# Patient Record
Sex: Female | Born: 1980 | Hispanic: No | Marital: Married | Smoking: Never smoker
Health system: Southern US, Community
[De-identification: ages and names within clinical notes are randomized; demographics above are authoritative.]

## PROBLEM LIST (undated history)

## (undated) DIAGNOSIS — Z789 Other specified health status: Secondary | ICD-10-CM

## (undated) DIAGNOSIS — O139 Gestational [pregnancy-induced] hypertension without significant proteinuria, unspecified trimester: Secondary | ICD-10-CM

## (undated) DIAGNOSIS — A159 Respiratory tuberculosis unspecified: Secondary | ICD-10-CM

## (undated) HISTORY — DX: Respiratory tuberculosis unspecified: A15.9

## (undated) HISTORY — DX: Gestational (pregnancy-induced) hypertension without significant proteinuria, unspecified trimester: O13.9

## (undated) HISTORY — DX: Other specified health status: Z78.9

---

## 2005-02-06 DIAGNOSIS — O139 Gestational [pregnancy-induced] hypertension without significant proteinuria, unspecified trimester: Secondary | ICD-10-CM

## 2010-11-08 ENCOUNTER — Other Ambulatory Visit: Payer: Self-pay | Admitting: Infectious Diseases

## 2010-11-08 ENCOUNTER — Ambulatory Visit
Admission: RE | Admit: 2010-11-08 | Discharge: 2010-11-08 | Disposition: A | Discharge status: Home / Self Care | Payer: No Typology Code available for payment source | Source: Ambulatory Visit | Attending: Infectious Diseases | Admitting: Infectious Diseases

## 2010-11-08 DIAGNOSIS — R7611 Nonspecific reaction to tuberculin skin test without active tuberculosis: Secondary | ICD-10-CM

## 2018-03-22 ENCOUNTER — Other Ambulatory Visit: Payer: Self-pay

## 2018-03-22 ENCOUNTER — Encounter (HOSPITAL_COMMUNITY): Payer: Self-pay | Admitting: Emergency Medicine

## 2018-03-22 ENCOUNTER — Emergency Department (HOSPITAL_COMMUNITY): Payer: Medicaid Other

## 2018-03-22 ENCOUNTER — Emergency Department (HOSPITAL_COMMUNITY)
Admission: EM | Admit: 2018-03-22 | Discharge: 2018-03-22 | Disposition: A | Discharge status: Home / Self Care | Payer: Medicaid Other | Attending: Emergency Medicine | Admitting: Emergency Medicine

## 2018-03-22 DIAGNOSIS — Z3A11 11 weeks gestation of pregnancy: Secondary | ICD-10-CM

## 2018-03-22 DIAGNOSIS — O4690 Antepartum hemorrhage, unspecified, unspecified trimester: Secondary | ICD-10-CM

## 2018-03-22 DIAGNOSIS — O209 Hemorrhage in early pregnancy, unspecified: Secondary | ICD-10-CM | POA: Diagnosis not present

## 2018-03-22 LAB — I-STAT BETA HCG BLOOD, ED (MC, WL, AP ONLY): I-stat hCG, quantitative: >2000 m[IU]/mL — ABNORMAL HIGH (ref <5)

## 2018-03-22 LAB — WET PREP, GENITAL
Clue Cells Wet Prep HPF POC: NONE SEEN
Sperm: NONE SEEN
Trich, Wet Prep: NONE SEEN
YEAST WET PREP: NONE SEEN

## 2018-03-22 LAB — URINALYSIS, ROUTINE W REFLEX MICROSCOPIC
Bilirubin Urine: NEGATIVE
Glucose, UA: NEGATIVE mg/dL
Ketones, ur: NEGATIVE mg/dL
Leukocytes, UA: NEGATIVE
Nitrite: NEGATIVE
Protein, ur: NEGATIVE mg/dL
SPECIFIC GRAVITY, URINE: 1.002 — AB (ref 1.005–1.030)
pH: 8 (ref 5.0–8.0)

## 2018-03-22 LAB — CBC WITH DIFFERENTIAL/PLATELET
Abs Immature Granulocytes: 0.02 10*3/uL (ref 0.00–0.07)
Basophils Absolute: 0 10*3/uL (ref 0.0–0.1)
Basophils Relative: 0 %
Eosinophils Absolute: 0.1 10*3/uL (ref 0.0–0.5)
Eosinophils Relative: 1 %
HCT: 35.1 % — ABNORMAL LOW (ref 36.0–46.0)
Hemoglobin: 11.1 g/dL — ABNORMAL LOW (ref 12.0–15.0)
IMMATURE GRANULOCYTES: 0 %
Lymphocytes Relative: 20 %
Lymphs Abs: 2.1 10*3/uL (ref 0.7–4.0)
MCH: 25.7 pg — ABNORMAL LOW (ref 26.0–34.0)
MCHC: 31.6 g/dL (ref 30.0–36.0)
MCV: 81.3 fL (ref 80.0–100.0)
MONOS PCT: 5 %
Monocytes Absolute: 0.5 10*3/uL (ref 0.1–1.0)
Neutro Abs: 7.6 10*3/uL (ref 1.7–7.7)
Neutrophils Relative %: 74 %
PLATELETS: 169 10*3/uL (ref 150–400)
RBC: 4.32 MIL/uL (ref 3.87–5.11)
RDW: 13.4 % (ref 11.5–15.5)
WBC: 10.4 10*3/uL (ref 4.0–10.5)
nRBC: 0 % (ref 0.0–0.2)

## 2018-03-22 LAB — ABO/RH: ABO/RH(D): O POS

## 2018-03-22 LAB — BASIC METABOLIC PANEL
Anion gap: 9 (ref 5–15)
BUN: <5 mg/dL — ABNORMAL LOW (ref 6–20)
CO2: 21 mmol/L — ABNORMAL LOW (ref 22–32)
Calcium: 9.6 mg/dL (ref 8.9–10.3)
Chloride: 107 mmol/L (ref 98–111)
Creatinine, Ser: 0.48 mg/dL (ref 0.44–1.00)
GFR calc Af Amer: >60 mL/min (ref >60)
GFR calc non Af Amer: >60 mL/min (ref >60)
GLUCOSE: 111 mg/dL — AB (ref 70–99)
Potassium: 3.5 mmol/L (ref 3.5–5.1)
Sodium: 137 mmol/L (ref 135–145)

## 2018-03-22 NOTE — Discharge Instructions (Signed)
Return to ED for worsening symptoms, increased bleeding, lightheadedness, severe abdominal pain, chest pain or leg swelling.

## 2018-03-22 NOTE — ED Notes (Signed)
Patient verbalizes understanding of discharge instructions. Opportunity for questioning and answers were provided. Armband removed by staff, pt discharged from ED ambulatory to home.  

## 2018-03-22 NOTE — ED Notes (Signed)
Patient transported to Ultrasound 

## 2018-03-22 NOTE — ED Triage Notes (Signed)
N[place]ali Interpreter on ipad used during triage. Pt states she is 3 months pregnant and had vaginal bleeding around 11am today. Stopped for a little bit and is bleeding again now. When asked about how much bleeding, Pt states bleeding is like "period bleeding." Pt states she originally had some abdominal pain but currently only has back pain. Pt states she went to a hospital for an appointment yesterday and she took a vitamin and now has this new problem.

## 2018-03-22 NOTE — ED Provider Notes (Signed)
[facility] EMERGENCY DEPARTMENT Provider Note   CSN: [id number] Arrival date & time: 03/22/18  1526     History   Chief Complaint Chief Complaint  Patient presents with  . Vaginal Bleeding    HPI Kristine Silva is a 38 y.o. female who is currently 3 months pregnant presents to ED for vaginal bleeding that began 5 hours prior to arrival.  Patient states that she receives her prenatal care at the public health department.  She was given a prenatal vitamin yesterday and she believes this caused her bleeding.  She has gone through 1 pad since the bleeding started.  She complains of back pain but denies any abdominal or pelvic pain.  She has not yet received an ultrasound for this pregnancy.  She did not have bleeding with her prior to successful pregnancies.  She denies any anticoagulant use, changes to bowel movements, vomiting, fever, urinary symptoms.  The history is limited by a language barrier. A language interpreter was used.    History reviewed. No pertinent past medical history.  There are no active problems to display for this patient.   History reviewed. No pertinent surgical history.   OB History    Gravida  1   Para      Term      Preterm      AB      Living        SAB      TAB      Ectopic      Multiple      Live Births               Home Medications    Prior to Admission medications   Not on File    Family History No family history on file.  Social History Social History   Tobacco Use  . Smoking status: Not on file  Substance Use Topics  . Alcohol use: Not on file  . Drug use: Not on file     Allergies   Patient has no known allergies.   Review of Systems Review of Systems  Constitutional: Negative for appetite change and chills.  HENT: Negative for ear pain, rhinorrhea, sneezing and sore throat.   Eyes: Negative for photophobia and visual disturbance.  Respiratory: Negative for cough, chest  tightness, shortness of breath and wheezing.   Cardiovascular: Negative for chest pain and palpitations.  Gastrointestinal: Negative for blood in stool, constipation, diarrhea and vomiting.  Genitourinary: Positive for vaginal bleeding. Negative for hematuria and urgency.  Musculoskeletal: Positive for back pain. Negative for myalgias.  Skin: Negative for rash.  Neurological: Negative for weakness and light-headedness.     Physical Exam Updated Vital Signs BP 118/81 (BP Location: Right Arm)   Pulse 83   Temp 98.9 F (37.2 C) (Oral)   Resp 18   SpO2 100%   Physical Exam Vitals signs and nursing note reviewed. Exam conducted with a chaperone present.  Constitutional:      General: She is not in acute distress.    Appearance: She is well-developed.  HENT:     Head: Normocephalic and atraumatic.     Nose: Nose normal.  Eyes:     General: No scleral icterus.       Right eye: No discharge.        Left eye: No discharge.     Conjunctiva/sclera: Conjunctivae normal.  Neck:     Musculoskeletal: Normal range of motion and neck supple.  Cardiovascular:     Rate and Rhythm: Normal rate and regular rhythm.     Heart sounds: Normal heart sounds. No murmur. No friction rub. No gallop.   Pulmonary:     Effort: Pulmonary effort is normal. No respiratory distress.     Breath sounds: Normal breath sounds.  Abdominal:     General: Bowel sounds are normal. There is no distension.     Palpations: Abdomen is soft.     Tenderness: There is no abdominal tenderness. There is no guarding.  Genitourinary:    Vagina: Bleeding present.     Cervix: No cervical bleeding.     Uterus: Not tender.      Adnexa:        Right: No tenderness.         Left: No tenderness.       Comments: Pelvic exam: normal external genitalia without evidence of trauma. VULVA: normal appearing vulva with no masses, tenderness or lesion. VAGINA: normal appearing vagina with normal color and brown discharge noted, scant  vaginal bleeding.  No lesions. CERVIX: normal appearing cervix without lesions, cervical motion tenderness absent, cervical os closed with out purulent discharge; No vaginal discharge. Wet prep and DNA probe for chlamydia and GC obtained.   ADNEXA: normal adnexa in size, nontender and no masses UTERUS: uterus is normal size, shape, consistency and nontender.   Musculoskeletal: Normal range of motion.  Skin:    General: Skin is warm and dry.     Findings: No rash.  Neurological:     Mental Status: She is alert.     Motor: No abnormal muscle tone.     Coordination: Coordination normal.      ED Treatments / Results  Labs (all labs ordered are listed, but only abnormal results are displayed) Labs Reviewed  WET PREP, GENITAL - Abnormal; Notable for the following components:      Result Value   WBC, Wet Prep HPF POC RARE (*)    All other components within normal limits  BASIC METABOLIC PANEL - Abnormal; Notable for the following components:   CO2 21 (*)    Glucose, Bld 111 (*)    BUN <5 (*)    All other components within normal limits  CBC WITH DIFFERENTIAL/PLATELET - Abnormal; Notable for the following components:   Hemoglobin 11.1 (*)    HCT 35.1 (*)    MCH 25.7 (*)    All other components within normal limits  URINALYSIS, ROUTINE W REFLEX MICROSCOPIC - Abnormal; Notable for the following components:   Color, Urine STRAW (*)    Specific Gravity, Urine 1.002 (*)    Hgb urine dipstick LARGE (*)    Bacteria, UA RARE (*)    All other components within normal limits  I-STAT BETA HCG BLOOD, ED ([facility], [facility], [facility] ONLY) - Abnormal; Notable for the following components:   I-stat hCG, quantitative >2,000.0 (*)    All other components within normal limits  URINE CULTURE  ABO/RH  GC/CHLAMYDIA PROBE AMP ([place]) NOT AT [facility]    EKG None  Radiology [place] Ob Comp < 14 Wks  Result Date: 03/22/2018 CLINICAL DATA:  Pregnant patient with bleeding. EXAM: OBSTETRIC <14 WK US AND TRANSVAGINAL  OB US TECHNIQUE: Both transabdominal and transvaginal ultrasound examinations were performed for complete evaluation of the gestation as well as the maternal uterus, adnexal regions, and pelvic cul-de-sac. Transvaginal technique was performed to assess early pregnancy. COMPARISON:  None. FINDINGS: Intrauterine gestational sac: Single Yolk sac:  Not Visualized.  Embryo:  Visualized. Cardiac Activity: Visualized. Heart Rate: 185 bpm MSD:   mm    w     d CRL:  44.8 mm   11 w   2 d                  [place] EDC: October 09, 2018 Subchorionic hemorrhage:  None visualized. Maternal uterus/adnexae: Ovaries are normal in appearance. There is a small amount of fluid in the cervical canal. No funneling of the cervix identified. IMPRESSION: 1. Single live IUP. 2. There is a small amount of fluid in the cervical canal which is nonspecific. No cervical funneling identified. Electronically Signed   By: Gerome Sam III M.D   On: 03/22/2018 18:55   US Ob Transvaginal  Result Date: 03/22/2018 CLINICAL DATA:  Pregnant patient with bleeding. EXAM: OBSTETRIC <14 WK [place] AND TRANSVAGINAL OB US TECHNIQUE: Both transabdominal and transvaginal ultrasound examinations were performed for complete evaluation of the gestation as well as the maternal uterus, adnexal regions, and pelvic cul-de-sac. Transvaginal technique was performed to assess early pregnancy. COMPARISON:  None. FINDINGS: Intrauterine gestational sac: Single Yolk sac:  Not Visualized. Embryo:  Visualized. Cardiac Activity: Visualized. Heart Rate: 185 bpm MSD:   mm    w     d CRL:  44.8 mm   11 w   2 d                  [place] EDC: October 09, 2018 Subchorionic hemorrhage:  None visualized. Maternal uterus/adnexae: Ovaries are normal in appearance. There is a small amount of fluid in the cervical canal. No funneling of the cervix identified. IMPRESSION: 1. Single live IUP. 2. There is a small amount of fluid in the cervical canal which is nonspecific. No cervical funneling identified.  Electronically Signed   By: Gerome Sam III M.D   On: 03/22/2018 18:55    Procedures Procedures (including critical care time)  Medications Ordered in ED Medications - No data to display   Initial Impression / Assessment and Plan / ED Course  I have reviewed the triage vital signs and the nursing notes.  Pertinent labs & imaging results that were available during my care of the patient were reviewed by me and considered in my medical decision making (see chart for details).     38 year old female who is currently 3 months pregnant presents to ED for vaginal bleeding.  The bleeding began this morning.  She was seen and evaluated at the health department yesterday and was given prenatal vitamins.  She took 1 dose of this and believes that this is causing the bleeding.  She has only used 1 pad since bleeding started.  She has not yet undergone an ultrasound for this pregnancy.  Her prior 2 pregnancies were successful.  On my exam there is no abdominal tenderness palpation.  She is overall well-appearing.  Vital signs are within normal limits.  hCG is greater than 2000.  Lab work significant for hemoglobin of 11, urinalysis with rare bacteria and sent for culture.  BMP, wet prep is unremarkable.  Ultrasound shows single live IUP.  Pelvic exam revealed only scant amount of bleeding with a closed cervical os.  Will advise patient to continue prenatal vitamins, follow-up with PCP and to return to ED or [facility] for any worsening symptoms.  Patient is hemodynamically stable, in NAD, and able to ambulate in the ED. Evaluation does not show pathology that would require ongoing emergent intervention or inpatient treatment.  I explained the diagnosis to the patient. Pain has been managed and has no complaints prior to discharge. Patient is comfortable with above plan and is stable for discharge at this time. All questions were answered prior to disposition. Strict return precautions for returning  to the ED were discussed. Encouraged follow up with PCP.    Portions of this note were generated with Scientist, clinical (histocompatibility and immunogenetics)Dragon dictation software. Dictation errors may occur despite best attempts at proofreading.  Final Clinical Impressions(s) / ED Diagnoses   Final diagnoses:  [redacted] weeks gestation of pregnancy  Vaginal bleeding in pregnancy    ED Discharge Orders    None       Dietrich PatesKhatri, Kymoni Monday, PA-C 03/22/18 Haskel Schroeder1920    Nanavati, Ankit, MD 03/23/18 1115

## 2018-03-22 NOTE — ED Notes (Signed)
Pt is seeing [facility] public health department for her prenatal care

## 2018-03-23 LAB — URINE CULTURE: Culture: NO GROWTH

## 2018-03-24 LAB — GC/CHLAMYDIA PROBE AMP (~~LOC~~) NOT AT ARMC
Chlamydia: NEGATIVE
Neisseria Gonorrhea: NEGATIVE

## 2018-05-22 DIAGNOSIS — Z789 Other specified health status: Secondary | ICD-10-CM | POA: Diagnosis not present

## 2018-05-22 DIAGNOSIS — Z0389 Encounter for observation for other suspected diseases and conditions ruled out: Secondary | ICD-10-CM | POA: Diagnosis not present

## 2018-05-22 DIAGNOSIS — Z3689 Encounter for other specified antenatal screening: Secondary | ICD-10-CM | POA: Diagnosis not present

## 2018-05-22 DIAGNOSIS — Z3482 Encounter for supervision of other normal pregnancy, second trimester: Secondary | ICD-10-CM | POA: Diagnosis not present

## 2018-05-22 DIAGNOSIS — E663 Overweight: Secondary | ICD-10-CM | POA: Diagnosis not present

## 2018-05-22 DIAGNOSIS — O09529 Supervision of elderly multigravida, unspecified trimester: Secondary | ICD-10-CM | POA: Diagnosis not present

## 2018-05-22 DIAGNOSIS — Z1388 Encounter for screening for disorder due to exposure to contaminants: Secondary | ICD-10-CM | POA: Diagnosis not present

## 2018-05-22 DIAGNOSIS — Z3009 Encounter for other general counseling and advice on contraception: Secondary | ICD-10-CM | POA: Diagnosis not present

## 2018-05-22 LAB — OB RESULTS CONSOLE HIV ANTIBODY (ROUTINE TESTING): HIV: NONREACTIVE

## 2018-05-22 LAB — CYTOLOGY - PAP: Pap: NEGATIVE

## 2018-05-22 LAB — OB RESULTS CONSOLE HEPATITIS B SURFACE ANTIGEN: Hepatitis B Surface Ag: NEGATIVE

## 2018-05-22 LAB — DRUG SCREEN, URINE

## 2018-05-22 LAB — CYSTIC FIBROSIS MUTATION 97: Cystic Fibrosis Mutat: NEGATIVE

## 2018-05-22 LAB — OB RESULTS CONSOLE ABO/RH: RH Type: POSITIVE

## 2018-05-22 LAB — GLUCOSE, 1 HOUR: Glucose 1 Hour: 99

## 2018-05-22 LAB — OB RESULTS CONSOLE ANTIBODY SCREEN: Antibody Screen: NEGATIVE

## 2018-05-22 LAB — OB RESULTS CONSOLE RUBELLA ANTIBODY, IGM: Rubella: IMMUNE

## 2018-05-22 LAB — OB RESULTS CONSOLE RPR
RPR: NONREACTIVE
RPR: NONREACTIVE

## 2018-05-22 LAB — CULTURE, OB URINE: Urine Culture, OB: <5000

## 2018-05-22 LAB — OB RESULTS CONSOLE VARICELLA ZOSTER ANTIBODY, IGG: Varicella: UNDETERMINED

## 2018-05-23 LAB — HEMOGLOBINOPATHY EVALUATION

## 2018-05-26 ENCOUNTER — Other Ambulatory Visit (HOSPITAL_COMMUNITY): Payer: Self-pay | Admitting: Obstetrics & Gynecology

## 2018-05-26 DIAGNOSIS — Z36 Encounter for antenatal screening for chromosomal anomalies: Secondary | ICD-10-CM

## 2018-05-29 ENCOUNTER — Encounter (HOSPITAL_COMMUNITY): Payer: Self-pay | Admitting: *Deleted

## 2018-05-30 ENCOUNTER — Ambulatory Visit (HOSPITAL_COMMUNITY): Payer: Medicaid Other

## 2018-05-30 ENCOUNTER — Encounter (HOSPITAL_COMMUNITY): Payer: Self-pay

## 2018-05-30 ENCOUNTER — Other Ambulatory Visit: Payer: Self-pay

## 2018-05-30 ENCOUNTER — Ambulatory Visit (HOSPITAL_COMMUNITY): Payer: Medicaid Other | Admitting: *Deleted

## 2018-05-30 ENCOUNTER — Ambulatory Visit (HOSPITAL_COMMUNITY)
Admission: RE | Admit: 2018-05-30 | Discharge: 2018-05-30 | Disposition: A | Discharge status: Home / Self Care | Payer: Medicaid Other | Source: Ambulatory Visit | Attending: Obstetrics and Gynecology | Admitting: Obstetrics and Gynecology

## 2018-05-30 ENCOUNTER — Ambulatory Visit (HOSPITAL_COMMUNITY): Payer: Self-pay | Admitting: Obstetrics and Gynecology

## 2018-05-30 VITALS — BP 113/73 | HR 83 | Temp 98.6°F | Ht 61.0 in

## 2018-05-30 DIAGNOSIS — Z1371 Encounter for nonprocreative screening for genetic disease carrier status: Secondary | ICD-10-CM | POA: Diagnosis present

## 2018-05-30 DIAGNOSIS — O09523 Supervision of elderly multigravida, third trimester: Secondary | ICD-10-CM | POA: Diagnosis present

## 2018-05-30 DIAGNOSIS — O09292 Supervision of pregnancy with other poor reproductive or obstetric history, second trimester: Secondary | ICD-10-CM

## 2018-05-30 DIAGNOSIS — Z36 Encounter for antenatal screening for chromosomal anomalies: Secondary | ICD-10-CM

## 2018-05-30 DIAGNOSIS — Z363 Encounter for antenatal screening for malformations: Secondary | ICD-10-CM

## 2018-05-30 DIAGNOSIS — Z1379 Encounter for other screening for genetic and chromosomal anomalies: Secondary | ICD-10-CM

## 2018-05-30 DIAGNOSIS — O09522 Supervision of elderly multigravida, second trimester: Secondary | ICD-10-CM

## 2018-05-30 DIAGNOSIS — O34219 Maternal care for unspecified type scar from previous cesarean delivery: Secondary | ICD-10-CM

## 2018-05-30 DIAGNOSIS — Z3A21 21 weeks gestation of pregnancy: Secondary | ICD-10-CM

## 2018-06-10 LAB — AFP, SERUM, OPEN SPINA BIFIDA
AFP MoM: 1.25
AFP Value: 77.4 ng/mL
Gest. Age on Collection Date: 21.1 weeks
Maternal Age At EDD: 36.6 yr
OSBR Risk 1 IN: 5576
TEST RESULTS AFP: NEGATIVE
Weight: 164 [lb_av]

## 2018-06-10 LAB — MATERNIT 21 PLUS CORE, BLOOD
Fetal Fraction: 8
Result (T21): NEGATIVE
Trisomy 13 (Patau syndrome): NEGATIVE
Trisomy 18 (Edwards syndrome): NEGATIVE
Trisomy 21 (Down syndrome): NEGATIVE

## 2018-06-10 LAB — INHERITEST CORE(CF97,SMA,FRAX)

## 2018-06-10 LAB — HEMOGLOBINOPATHY EVALUATION
HGB C: 0 %
HGB S: 0 %
HGB VARIANT: 25.8 % — ABNORMAL HIGH
Hemoglobin A2 Quantitation: 4.3 % — ABNORMAL HIGH (ref 1.8–3.2)
Hemoglobin F Quantitation: 0.6 % (ref 0.0–2.0)
Hgb A: 69.3 % — ABNORMAL LOW (ref 96.4–98.8)

## 2018-07-18 DIAGNOSIS — Z3483 Encounter for supervision of other normal pregnancy, third trimester: Secondary | ICD-10-CM | POA: Diagnosis not present

## 2018-07-18 DIAGNOSIS — Z1388 Encounter for screening for disorder due to exposure to contaminants: Secondary | ICD-10-CM | POA: Diagnosis not present

## 2018-07-18 DIAGNOSIS — Z3009 Encounter for other general counseling and advice on contraception: Secondary | ICD-10-CM | POA: Diagnosis not present

## 2018-07-18 DIAGNOSIS — O99019 Anemia complicating pregnancy, unspecified trimester: Secondary | ICD-10-CM | POA: Diagnosis not present

## 2018-07-18 DIAGNOSIS — Z0389 Encounter for observation for other suspected diseases and conditions ruled out: Secondary | ICD-10-CM | POA: Diagnosis not present

## 2018-07-18 LAB — VITAMIN B12: VITAMIN B12: 332

## 2018-07-18 LAB — HEMOGLOBINOPATHY EVALUATION

## 2018-07-18 LAB — OB RESULTS CONSOLE HGB/HCT, BLOOD
HCT: 29 (ref 29–41)
Hemoglobin: 8.8

## 2018-07-18 LAB — FOLATE RBC: Folate, RBC: 1523

## 2018-07-18 LAB — GLUCOSE, 1 HOUR: Glucose 1 Hour: 132

## 2018-07-18 LAB — FERRITIN: Ferritin: 226

## 2018-07-18 LAB — OB RESULTS CONSOLE HIV ANTIBODY (ROUTINE TESTING): HIV: NONREACTIVE

## 2018-07-18 LAB — DRUG SCREEN, URINE

## 2018-08-07 DIAGNOSIS — O09529 Supervision of elderly multigravida, unspecified trimester: Secondary | ICD-10-CM | POA: Diagnosis not present

## 2018-08-07 DIAGNOSIS — O34211 Maternal care for low transverse scar from previous cesarean delivery: Secondary | ICD-10-CM | POA: Diagnosis not present

## 2018-08-07 DIAGNOSIS — D582 Other hemoglobinopathies: Secondary | ICD-10-CM | POA: Diagnosis not present

## 2018-08-07 DIAGNOSIS — N61 Mastitis without abscess: Secondary | ICD-10-CM | POA: Diagnosis not present

## 2018-08-07 DIAGNOSIS — Z3483 Encounter for supervision of other normal pregnancy, third trimester: Secondary | ICD-10-CM | POA: Diagnosis not present

## 2018-08-07 DIAGNOSIS — E663 Overweight: Secondary | ICD-10-CM | POA: Diagnosis not present

## 2018-08-07 DIAGNOSIS — Z550 Illiteracy and low-level literacy: Secondary | ICD-10-CM | POA: Diagnosis not present

## 2018-08-07 DIAGNOSIS — O99330 Smoking (tobacco) complicating pregnancy, unspecified trimester: Secondary | ICD-10-CM | POA: Diagnosis not present

## 2018-08-07 DIAGNOSIS — Z8759 Personal history of other complications of pregnancy, childbirth and the puerperium: Secondary | ICD-10-CM | POA: Diagnosis not present

## 2018-08-07 DIAGNOSIS — Z789 Other specified health status: Secondary | ICD-10-CM | POA: Diagnosis not present

## 2018-08-07 DIAGNOSIS — Z3482 Encounter for supervision of other normal pregnancy, second trimester: Secondary | ICD-10-CM | POA: Diagnosis not present

## 2018-08-14 DIAGNOSIS — D582 Other hemoglobinopathies: Secondary | ICD-10-CM | POA: Diagnosis not present

## 2018-08-14 DIAGNOSIS — Z3483 Encounter for supervision of other normal pregnancy, third trimester: Secondary | ICD-10-CM | POA: Diagnosis not present

## 2018-08-14 DIAGNOSIS — O99330 Smoking (tobacco) complicating pregnancy, unspecified trimester: Secondary | ICD-10-CM | POA: Diagnosis not present

## 2018-08-14 DIAGNOSIS — O99019 Anemia complicating pregnancy, unspecified trimester: Secondary | ICD-10-CM | POA: Diagnosis not present

## 2018-08-14 DIAGNOSIS — N61 Mastitis without abscess: Secondary | ICD-10-CM | POA: Diagnosis not present

## 2018-08-14 DIAGNOSIS — Z8759 Personal history of other complications of pregnancy, childbirth and the puerperium: Secondary | ICD-10-CM | POA: Diagnosis not present

## 2018-08-14 DIAGNOSIS — E663 Overweight: Secondary | ICD-10-CM | POA: Diagnosis not present

## 2018-08-14 DIAGNOSIS — O34211 Maternal care for low transverse scar from previous cesarean delivery: Secondary | ICD-10-CM | POA: Diagnosis not present

## 2018-08-14 DIAGNOSIS — Z789 Other specified health status: Secondary | ICD-10-CM | POA: Diagnosis not present

## 2018-08-14 DIAGNOSIS — Z550 Illiteracy and low-level literacy: Secondary | ICD-10-CM | POA: Diagnosis not present

## 2018-08-14 DIAGNOSIS — O09529 Supervision of elderly multigravida, unspecified trimester: Secondary | ICD-10-CM | POA: Diagnosis not present

## 2018-08-18 ENCOUNTER — Telehealth: Payer: Self-pay | Admitting: Family Medicine

## 2018-08-18 DIAGNOSIS — O99019 Anemia complicating pregnancy, unspecified trimester: Secondary | ICD-10-CM | POA: Diagnosis not present

## 2018-08-18 LAB — OB RESULTS CONSOLE HGB/HCT, BLOOD
HCT: 31 (ref 29–41)
Hemoglobin: 9.8

## 2018-08-18 LAB — OB RESULTS CONSOLE PLATELET COUNT: Platelets: 96

## 2018-08-18 NOTE — Telephone Encounter (Signed)
Attempted to call the patient to give her information about her appointment. With the interpreter [phone], a message was not able to be left due to the call not going through.

## 2018-08-20 DIAGNOSIS — Z3483 Encounter for supervision of other normal pregnancy, third trimester: Secondary | ICD-10-CM | POA: Diagnosis not present

## 2018-08-21 ENCOUNTER — Encounter: Payer: Self-pay | Admitting: *Deleted

## 2018-08-25 ENCOUNTER — Encounter: Payer: Self-pay | Admitting: Hematology

## 2018-08-25 ENCOUNTER — Encounter: Payer: Self-pay | Admitting: Obstetrics and Gynecology

## 2018-08-25 ENCOUNTER — Ambulatory Visit: Payer: Self-pay | Admitting: Obstetrics and Gynecology

## 2018-08-25 ENCOUNTER — Other Ambulatory Visit: Payer: Self-pay

## 2018-08-25 ENCOUNTER — Telehealth: Payer: Self-pay | Admitting: Hematology

## 2018-08-25 DIAGNOSIS — O09529 Supervision of elderly multigravida, unspecified trimester: Secondary | ICD-10-CM | POA: Insufficient documentation

## 2018-08-25 DIAGNOSIS — Z98891 History of uterine scar from previous surgery: Secondary | ICD-10-CM | POA: Insufficient documentation

## 2018-08-25 DIAGNOSIS — Z5329 Procedure and treatment not carried out because of patient's decision for other reasons: Secondary | ICD-10-CM

## 2018-08-25 DIAGNOSIS — D696 Thrombocytopenia, unspecified: Secondary | ICD-10-CM | POA: Insufficient documentation

## 2018-08-25 DIAGNOSIS — O0990 Supervision of high risk pregnancy, unspecified, unspecified trimester: Secondary | ICD-10-CM | POA: Insufficient documentation

## 2018-08-25 DIAGNOSIS — Z789 Other specified health status: Secondary | ICD-10-CM | POA: Insufficient documentation

## 2018-08-25 DIAGNOSIS — D582 Other hemoglobinopathies: Secondary | ICD-10-CM | POA: Insufficient documentation

## 2018-08-25 NOTE — Progress Notes (Signed)
Patient did not keep her OB referral appointment for 08/25/2018.  With rescheduling, please make her an in person visit  Durene Romans MD Attending Center for [facility])

## 2018-08-25 NOTE — Progress Notes (Signed)
Called pt for Webex visit using [employer] id# [phone], pt did not  Answer & no VM set up.  Called pt back , Interpreter put me on hold but never came back, since 2nd attempt, will need to  re-schedule.

## 2018-08-25 NOTE — Telephone Encounter (Signed)
Received a new hem referral from Dr. Rosana Hoes for thrombocytopenia. Pt has been scheduled to see Dr. Irene Limbo on 7/8 at 1pm. Letter mailed.

## 2018-09-08 ENCOUNTER — Telehealth: Payer: Self-pay | Admitting: Family Medicine

## 2018-09-08 ENCOUNTER — Encounter: Payer: Self-pay | Admitting: Obstetrics and Gynecology

## 2018-09-08 NOTE — Telephone Encounter (Signed)
Attempted to call patient with Kristine Silva #[phone]. Number was not available to leave a message.

## 2018-09-09 ENCOUNTER — Encounter: Payer: Self-pay | Admitting: *Deleted

## 2018-09-09 NOTE — Progress Notes (Signed)
HEMATOLOGY/ONCOLOGY CONSULTATION NOTE  Date of Service: 09/10/2018  Patient Care Team: Patient, No Pcp Per as PCP - General (General Practice)  CHIEF COMPLAINTS/PURPOSE OF CONSULTATION:  Thrombocytopenia  HISTORY OF PRESENTING ILLNESS:   Kristine Silva is a wonderful 38 y.o. female who has been referred to [place] by Dr. Vivien Rota for evaluation and management of Thrombocytopenia. She is accompanied today by a [place] interpreter. The pt reports that she is doing well overall. She is currently at [redacted] weeks gestation. She is anticipating a 10/02/18 cesarean section with Dr. Harolyn Rutherford.  The pt reports that she had low hemoglobin after a previous delivery. This is the patient's third pregnancy. Her two children are 28 and 45 years old. The pt has never had a blood transfusion. She is currently taking a prenatal vitamin. She denies any problems with her current pregnancy. She denies any concerns for bleeding. She denies nose bleeds, gum bleeds or abnormal bruising. She notes that she is eating very well.  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 The pt notes that her youngest child had prior anemia which has apparently been improving and now resolved. She is unaware if her husband also has Hgb E trait.   The pt's previous two pregnancies resulted in cesarean sections.  The pt denies taking any acid suppressants or NSAIDs recently.  Most recent lab results: 08/18/18 PLT at [facility] 07/18/18 Ferritin at 226, Vitamin B12 at 332. HGB at 9.0.  On review of systems, pt reports some lower back pain, stable energy levels, appropriate weight gain, and denies concerns for bleeding, nose bleeds, gum bleeds, abnormal bruising, abdominal pains, leg swelling, and any other symptoms.   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   MEDICAL HISTORY:  Past Medical History:  Diagnosis Date  . Medical history non-contributory   . Pregnancy induced hypertension   . Tuberculosis     Pt treated with rifampin for TB upon arrival. Pt stopped after a couple of days d/t reaction. second son treated for 1 year.    SURGICAL HISTORY: Past Surgical History:  Procedure Laterality Date  . CESAREAN SECTION      SOCIAL HISTORY: Social History   Socioeconomic History  . Marital status: Married    Spouse name: Not on file  . Number of children: Not on file  . Years of education: Not on file  . Highest education level: Not on file  Occupational History  . Not on file  Social Needs  . Financial resource strain: Not on file  . Food insecurity    Worry: Not on file    Inability: Not on file  . Transportation needs    Medical: Not on file    Non-medical: Not on file  Tobacco Use  . Smoking status: Never Smoker  . Smokeless tobacco: Never Used  Substance and Sexual Activity  . Alcohol use: Not Currently  . Drug use: Never  . Sexual activity: Not on file  Lifestyle  . Physical activity    Days per week: Not on file    Minutes per session: Not on file  . Stress: Not on file  Relationships  . Social Herbalist on phone: Not on file    Gets together: Not on file    Attends religious service: Not on file    Active member of club or organization: Not on file    Attends meetings of clubs or organizations: Not on  file    Relationship status: Not on file  . Intimate partner violence    Fear of current or ex partner: Not on file    Emotionally abused: Not on file    Physically abused: Not on file    Forced sexual activity: Not on file  Other Topics Concern  . Not on file  Social History Narrative  . Not on file    FAMILY HISTORY: No family history on file.  ALLERGIES:  has No Known Allergies.  MEDICATIONS:  Current Outpatient Medications  Medication Sig Dispense Refill  . Prenatal Vit-Fe Fumarate-FA (PRENATAL VITAMIN PO) Take by mouth.     No current facility-administered medications for this visit.     REVIEW OF SYSTEMS:    10 Point  review of Systems was done is negative except as noted above.  PHYSICAL EXAMINATION:  Vitals:   09/10/18 1338  BP: 119/76  Pulse: 91  Resp: 18  Temp: 97.7 F (36.5 C)  SpO2: 100%   Filed Weights   09/10/18 1338  Weight: 156 lb 6.4 oz (70.9 kg)   Body mass index is 29.55 kg/m.  GENERAL:alert, in no acute distress and comfortable SKIN: no acute rashes, no significant lesions EYES: conjunctiva are pink and non-injected, sclera anicteric OROPHARYNX: MMM, no exudates, no oropharyngeal erythema or ulceration NECK: supple, no JVD LYMPH:  no palpable lymphadenopathy in the cervical, axillary or inguinal regions LUNGS: clear to auscultation b/l with normal respiratory effort HEART: regular rate & rhythm ABDOMEN:  normoactive bowel sounds , non tender, not distended. Extremity: no pedal edema PSYCH: alert & oriented x 3 with fluent speech NEURO: no focal motor/sensory deficits  LABORATORY DATA:  I have reviewed the data as listed  . CBC Latest Ref Rng & Units 09/10/2018 08/18/2018 07/18/2018  WBC 4.0 - 10.5 K/uL 11.6(H) - -  Hemoglobin 12.0 - 15.0 g/dL 10.7(L) 9.8 8.8  Hematocrit 36.0 - 46.0 % 33.5(L) 31 29  Platelets 150 - 400 K/uL 130(L) 96 -    . CMP Latest Ref Rng & Units 09/10/2018 03/22/2018  Glucose 70 - 99 mg/dL 88 161(W111(H)  BUN 6 - 20 mg/dL 5(L) <9(U<5(L)  Creatinine 0.44 - 1.00 mg/dL 0.450.62 4.090.48  Sodium 811135 - 145 mmol/L 137 137  Potassium 3.5 - 5.1 mmol/L 3.9 3.5  Chloride 98 - 111 mmol/L 108 107  CO2 22 - 32 mmol/L 20(L) 21(L)  Calcium 8.9 - 10.3 mg/dL 8.9 9.6  Total Protein 6.5 - 8.1 g/dL 7.0 -  Total Bilirubin 0.3 - 1.2 mg/dL 0.6 -  Alkaline Phos 38 - 126 U/L 221(H) -  AST 15 - 41 U/L 29 -  ALT 0 - 44 U/L 24 -          RADIOGRAPHIC STUDIES: I have personally reviewed the radiological images as listed and agreed with the findings in the report. No results found.  ASSESSMENT & PLAN:  38 y.o. female with  1. 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 LDH and haptoglobin wnl -- no overt active hemolysis.  PLAN -Discussed patient's most recent labs from 08/18/18, PLT at 96k. 07/18/18 Ferritin at 226, Vitamin B12 at 332. 07/18/18 HGB at 9.0. -cbc today with PLT of 130k -- no further w/u indicated -Discussed the previous hemoglobinopathy evaluation which revealed 40% HGB E and 50-60% HGB A. -Discussed that having HGB E trait can cause some mild baseline anemia and microcytosis, and her RBC can have  increased hemolysis in times of stress. -discussed the possible signficance of HBE to baby based on what the fathers status of hemoglobinopathy could be. -Recommend staying well hydrated -Continue with prenatal vitamins long term, to aid red blood cell production given her Hgb E trait -Reasonable to also begin a Vitamin B complex through breast feeding -Discussed that the pt's PLT at 96k do not correspond to increased risk of abnormal bleeding.  -Discussed that PLT above 75k or 80k is sufficient to receive an epidural. If PLT are felt to be limiting, beneath 80k, pt could receive a unit of platelets prior to receiving an epidural. -Will order labs today- reviewed -Will see the pt back based on labs   Continue f/u with obGyn   All of the patients questions were answered with apparent satisfaction. The patient knows to call the clinic with any problems, questions or concerns.  The total time spent in the appt was 45 minutes and more than 50% was on counseling and direct patient cares.    Wyvonnia Lora  MD MS AAHIVMS [facility] Hematology/Oncology Physician [facility]  (Office):       [phone] (Work cell):  [phone] (Fax):           [phone]  09/10/2018 2:22 PM  I, Marcelline MatesSchuyler Bain, am acting as a scribe for Dr. Wyvonnia Lora .   .I have reviewed the above documentation for accuracy and completeness, and I agree with the above. Johney Maine. Kishore   MD

## 2018-09-10 ENCOUNTER — Inpatient Hospital Stay: Payer: Medicaid Other

## 2018-09-10 ENCOUNTER — Other Ambulatory Visit: Payer: Self-pay

## 2018-09-10 ENCOUNTER — Inpatient Hospital Stay: Payer: Medicaid Other | Attending: Hematology | Admitting: Hematology

## 2018-09-10 VITALS — BP 119/76 | HR 91 | Temp 97.7°F | Resp 18 | Ht 61.0 in | Wt 156.4 lb

## 2018-09-10 DIAGNOSIS — D582 Other hemoglobinopathies: Secondary | ICD-10-CM

## 2018-09-10 DIAGNOSIS — D696 Thrombocytopenia, unspecified: Secondary | ICD-10-CM

## 2018-09-10 DIAGNOSIS — O99113 Other diseases of the blood and blood-forming organs and certain disorders involving the immune mechanism complicating pregnancy, third trimester: Secondary | ICD-10-CM

## 2018-09-10 LAB — CMP (CANCER CENTER ONLY)
ALT: 24 U/L (ref 0–44)
AST: 29 U/L (ref 15–41)
Albumin: 2.9 g/dL — ABNORMAL LOW (ref 3.5–5.0)
Alkaline Phosphatase: 221 U/L — ABNORMAL HIGH (ref 38–126)
Anion gap: 9 (ref 5–15)
BUN: 5 mg/dL — ABNORMAL LOW (ref 6–20)
CO2: 20 mmol/L — ABNORMAL LOW (ref 22–32)
Calcium: 8.9 mg/dL (ref 8.9–10.3)
Chloride: 108 mmol/L (ref 98–111)
Creatinine: 0.62 mg/dL (ref 0.44–1.00)
GFR, Est AFR Am: >60 mL/min (ref >60)
GFR, Estimated: >60 mL/min (ref >60)
Glucose, Bld: 88 mg/dL (ref 70–99)
Potassium: 3.9 mmol/L (ref 3.5–5.1)
Sodium: 137 mmol/L (ref 135–145)
Total Bilirubin: 0.6 mg/dL (ref 0.3–1.2)
Total Protein: 7 g/dL (ref 6.5–8.1)

## 2018-09-10 LAB — CBC WITH DIFFERENTIAL/PLATELET
Abs Immature Granulocytes: 0.11 10*3/uL — ABNORMAL HIGH (ref 0.00–0.07)
Basophils Absolute: 0.1 10*3/uL (ref 0.0–0.1)
Basophils Relative: 0 %
Eosinophils Absolute: 0.1 10*3/uL (ref 0.0–0.5)
Eosinophils Relative: 1 %
HCT: 33.5 % — ABNORMAL LOW (ref 36.0–46.0)
Hemoglobin: 10.7 g/dL — ABNORMAL LOW (ref 12.0–15.0)
Immature Granulocytes: 1 %
Lymphocytes Relative: 18 %
Lymphs Abs: 2.1 10*3/uL (ref 0.7–4.0)
MCH: 29.3 pg (ref 26.0–34.0)
MCHC: 31.9 g/dL (ref 30.0–36.0)
MCV: 91.8 fL (ref 80.0–100.0)
Monocytes Absolute: 0.8 10*3/uL (ref 0.1–1.0)
Monocytes Relative: 7 %
Neutro Abs: 8.6 10*3/uL — ABNORMAL HIGH (ref 1.7–7.7)
Neutrophils Relative %: 73 %
Platelets: 130 10*3/uL — ABNORMAL LOW (ref 150–400)
RBC: 3.65 MIL/uL — ABNORMAL LOW (ref 3.87–5.11)
RDW: 17.8 % — ABNORMAL HIGH (ref 11.5–15.5)
WBC: 11.6 10*3/uL — ABNORMAL HIGH (ref 4.0–10.5)
nRBC: 0 % (ref 0.0–0.2)

## 2018-09-10 LAB — RETICULOCYTES
Immature Retic Fract: 34 % — ABNORMAL HIGH (ref 2.3–15.9)
RBC.: 3.64 MIL/uL — ABNORMAL LOW (ref 3.87–5.11)
Retic Count, Absolute: 175.1 10*3/uL (ref 19.0–186.0)
Retic Ct Pct: 4.8 % — ABNORMAL HIGH (ref 0.4–3.1)

## 2018-09-10 LAB — LACTATE DEHYDROGENASE: LDH: 142 U/L (ref 98–192)

## 2018-09-10 LAB — IMMATURE PLATELET FRACTION: Immature Platelet Fraction: 54.5 % — ABNORMAL HIGH (ref 1.2–8.6)

## 2018-09-11 ENCOUNTER — Telehealth: Payer: Self-pay | Admitting: Family Medicine

## 2018-09-11 ENCOUNTER — Telehealth: Payer: Self-pay | Admitting: Hematology

## 2018-09-11 LAB — HAPTOGLOBIN: Haptoglobin: 46 mg/dL (ref 33–278)

## 2018-09-11 NOTE — Telephone Encounter (Signed)
Patient's husband called in stating he wants to know what time his wife's appointment is tomorrow. Appointment information was given to the husband ( 7/10 @ 9:55). Patient's husband was instructed that the patient has to wear a face mask for the entire appointment and no visitors are allowed with her. Patient was screened for covid symptoms and denied having any.

## 2018-09-11 NOTE — Telephone Encounter (Signed)
No los per 7/8. °

## 2018-09-12 ENCOUNTER — Telehealth: Payer: Self-pay | Admitting: Obstetrics and Gynecology

## 2018-09-12 ENCOUNTER — Ambulatory Visit: Payer: Medicaid Other | Admitting: Family Medicine

## 2018-09-12 ENCOUNTER — Other Ambulatory Visit: Payer: Self-pay

## 2018-09-12 NOTE — Progress Notes (Signed)
0905 I called Jacque early to see if she was available for virtual visit. Per interpreter heard message the person you are calling is not available and no voicemail set up.  Linda,RN 0945 patient came to office - instructed by registrar her visit is virtual today and to please return to her car.  Called patient with interpreter to her home number four times  and it said person unavailable.No mobile number listed . We called spouse number and were told we are calling a wrong number.  Will need to be rescheduled. Linda,RN 10:30 Jakeline was called by front office to reschedule and she says still waiting for call- informed her we had been trying to reach her and asked clinical staff to try again.  I called [place] with [employer]  And she she did not answer. Asked registrar to call patient and she answered - patient admitted she had been getting other calls but did not answer because it did not say [facility] Registrar explained it will not say [facility] because is interpreter calling. Instructed her nurse with interpreter will call again and to answer.  She voiced understanding to registrar.  Called patient with interpreter and she did not answer. Will have appt rescheduled.  Linda,RN

## 2018-09-12 NOTE — Telephone Encounter (Signed)
The patient attended the appointment. Informed the patient of the appointment being virtual and requested the patient go back to her call to complete per [facility].   Vaughan Basta (nurse) later stated when she called the patient the patient did not answer the phone and reschedule the appointment.  I called the patient back and the patient stated no one called. She and her husband are still waiting in the car downstairs. I then gave the information to [place]. She stated she called 4x. She stated she will try once more as I informed her the next available appointment is around the 24th. I offered to transfer the call however the nurse stated she has another appointment coming up soon and she will try to call.  Informed the patient to please answer as the nurse will try again.  Vaughan Basta stated no answer again, I then called the patient who stated once more they did not get a call. Informed the nurse. She stated she is giving the number to the interpreter and they are calling the patient. Informed the patient of the information and advised if you receive a call it may not identify as our office as an interpreter is calling. The patient verbalized understanding.  Vaughan Basta stated the patient did not answer the phone again.  Rescheduled the appointment.

## 2018-09-15 ENCOUNTER — Telehealth: Payer: Self-pay | Admitting: *Deleted

## 2018-09-15 NOTE — Telephone Encounter (Signed)
Called patient with [employer] [phone] . Called home/mobile number and heard message not available and no option to leave message. I also called her contact number ( spouse)and  left message we are changing her appointment 50 09/17/18 at 1:35 and it is not in the office - is virtual- we will call her - please stay home. [place] Sylvan,RN

## 2018-09-16 NOTE — Telephone Encounter (Signed)
Called patient with pacific interpreter [phone], no answer and unable to leave message at home/mobile number. Called spouse's number, no answer- left message stating she has an appt tomorrow that is virtual at 135 and needs to be available during that time.

## 2018-09-17 ENCOUNTER — Encounter: Payer: Self-pay | Admitting: Medical

## 2018-09-17 ENCOUNTER — Encounter: Payer: Medicaid Other | Admitting: Medical

## 2018-09-17 ENCOUNTER — Other Ambulatory Visit: Payer: Self-pay

## 2018-09-17 ENCOUNTER — Telehealth: Payer: Self-pay | Admitting: Medical

## 2018-09-17 NOTE — Progress Notes (Signed)
2:40p-Called pt with Nepali interpreter id # [phone], no answer, was not able to leave a message .Will call back in 10 minutes.   2:55p-2nd attempt no answer, used [place] id# [phone] VM set up

## 2018-09-17 NOTE — Telephone Encounter (Signed)
Attempted to call patient w/ Nepali ID # [phone] to get her rescheduled for her missed OB appointment on 7/15. No answer, number stated that the call could not be completed. No show letter mailed

## 2018-09-17 NOTE — Patient Instructions (Signed)
Please reschedule your appointment as soon as possible 

## 2018-09-18 NOTE — Pre-Procedure Instructions (Signed)
Interpreter number [phone]

## 2018-09-18 NOTE — Patient Instructions (Signed)
[place]  09/18/2018   Your procedure is scheduled on:  10/02/2018  Arrive at 1000 at Entrance C on [address] at [facility]  and [address]. You are invited to use the FREE valet parking or use the Visitor's parking deck.  Pick up the phone at the desk and dial [phone].  Call this number if you have problems the morning of surgery: [phone]  Remember:   Do not eat food:(After Midnight) Desps de medianoche.  Do not drink clear liquids: (After Midnight) Desps de medianoche.  Take these medicines the morning of surgery with A SIP OF WATER:  none   Do not wear jewelry, make-up or nail polish.  Do not wear lotions, powders, or perfumes. Do not wear deodorant.  Do not shave 48 hours prior to surgery.  Do not bring valuables to the hospital.  [facility] is not   responsible for any belongings or valuables brought to the hospital.  Contacts, dentures or bridgework may not be worn into surgery.  Leave suitcase in the car. After surgery it may be brought to your room.  For patients admitted to the hospital, checkout time is 11:00 AM the day of              discharge.      Please read over the following fact sheets that you were given:     Preparing for Surgery

## 2018-09-22 ENCOUNTER — Ambulatory Visit (INDEPENDENT_AMBULATORY_CARE_PROVIDER_SITE_OTHER): Payer: Medicaid Other | Admitting: Nurse Practitioner

## 2018-09-22 ENCOUNTER — Telehealth (HOSPITAL_COMMUNITY): Payer: Self-pay | Admitting: *Deleted

## 2018-09-22 ENCOUNTER — Other Ambulatory Visit: Payer: Self-pay

## 2018-09-22 ENCOUNTER — Encounter: Payer: Self-pay | Admitting: Nurse Practitioner

## 2018-09-22 VITALS — BP 118/87 | HR 87 | Temp 98.0°F | Wt 158.3 lb

## 2018-09-22 DIAGNOSIS — O0993 Supervision of high risk pregnancy, unspecified, third trimester: Secondary | ICD-10-CM

## 2018-09-22 DIAGNOSIS — O0990 Supervision of high risk pregnancy, unspecified, unspecified trimester: Secondary | ICD-10-CM

## 2018-09-22 DIAGNOSIS — Z3A37 37 weeks gestation of pregnancy: Secondary | ICD-10-CM

## 2018-09-22 DIAGNOSIS — D649 Anemia, unspecified: Secondary | ICD-10-CM

## 2018-09-22 DIAGNOSIS — Z98891 History of uterine scar from previous surgery: Secondary | ICD-10-CM

## 2018-09-22 NOTE — Telephone Encounter (Signed)
Preadmission screen  

## 2018-09-22 NOTE — Progress Notes (Signed)
8:38a-called pt using [employer], id # [id number], no answer. Will retry in 10 min.   8:59a-2nd attempt, used Nepali Interpreter id# [phone], no answer, will need to reschedule.

## 2018-09-22 NOTE — Progress Notes (Signed)
Per Registrar, Pt was in [place] the whole time.

## 2018-09-22 NOTE — Progress Notes (Signed)
Pt states is having back pains, very uncomfortable, can not sleep due to pain.

## 2018-09-22 NOTE — Progress Notes (Signed)
    Subjective:  Kristine Silva is a 38 y.o. G3P2002 at [redacted]w[redacted]d being seen today for ongoing prenatal care.  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  Patient reports some low back pain and groin pain.  Contractions: Irritability. Vag. Bleeding: None.  Movement: Present. Denies leaking of fluid. No dysuria.  Back pain in in the low back bilaterally.  The following portions of the patient's history were reviewed and updated as appropriate: allergies, current medications, past family history, past medical history, past social history, past surgical history and problem list. Problem list updated.  Objective:   Vitals:   09/22/18 1011 09/22/18 1054  BP: 130/89 118/87  Pulse: 87   Temp: 98 F (36.7 C)   Weight: 158 lb 4.8 oz (71.8 kg)     Fetal Status: Fetal Heart Rate (bpm): 151 Fundal Height: 38 cm Movement: Present     General:  Alert, oriented and cooperative. Patient is in no acute distress.  Skin: Skin is warm and dry. No rash noted.   Cardiovascular: Normal heart rate noted  Respiratory: Normal respiratory effort, no problems with respiration noted  Abdomen: Soft, gravid, appropriate for gestational age. Pain/Pressure: Present     Pelvic:  Cervical exam deferred        Extremities: Normal range of motion.  Edema: None  Mental Status: Normal mood and affect. Normal behavior. Normal judgment and thought content.   Urinalysis:      Assessment and Plan:  Pregnancy: G3P2002 at [redacted]w[redacted]d  1. Supervision of high risk pregnancy, antepartum Watch BP carefully - Revewed with Dr. Harolyn Rutherford Reviewed entrance to the [facility] Reviewed her appointment for labs at MAU 2 days before C/S Advised that the hospital  will call her for that lab appointment and to review the time of her surgery Plans for husband to be with her at the hospital.  Husband knows the pediatric office - he takes children for their checkups.  2. Anemia Taking iron supplementation - saw hematologist and report reviewed in the chart.  3.  Scheduled C/S on 10-02-18  Term labor symptoms and general obstetric precautions including but not limited to vaginal bleeding, contractions, leaking of fluid and fetal movement were reviewed in detail with the patient. Please refer to After Visit Summary for other counseling recommendations.  Return in about 1 week (around 09/29/2018) for in person visit - needs BP check and ROB.  Earlie Server, RN, MSN, NP-BC Nurse Practitioner, [facility] for [employer], [place] [facility] 09/22/2018 11:53 AM

## 2018-09-22 NOTE — Pre-Procedure Instructions (Signed)
Interpreter number [phone]

## 2018-09-23 ENCOUNTER — Telehealth (HOSPITAL_COMMUNITY): Payer: Self-pay | Admitting: *Deleted

## 2018-09-23 NOTE — Telephone Encounter (Signed)
Preadmission screen interpreter number [phone]

## 2018-09-24 ENCOUNTER — Telehealth (HOSPITAL_COMMUNITY): Payer: Self-pay | Admitting: *Deleted

## 2018-09-24 NOTE — Telephone Encounter (Signed)
Preadmission screen Interpreter number [phone]

## 2018-09-25 ENCOUNTER — Telehealth (HOSPITAL_COMMUNITY): Payer: Self-pay | Admitting: *Deleted

## 2018-09-25 NOTE — Telephone Encounter (Signed)
Preadmission screen [phone] interpreter number

## 2018-09-26 ENCOUNTER — Encounter: Payer: Medicaid Other | Admitting: Obstetrics & Gynecology

## 2018-09-29 ENCOUNTER — Telehealth (HOSPITAL_COMMUNITY): Payer: Self-pay | Admitting: *Deleted

## 2018-09-29 ENCOUNTER — Inpatient Hospital Stay (HOSPITAL_COMMUNITY): Payer: Medicaid Other | Admitting: Anesthesiology

## 2018-09-29 ENCOUNTER — Encounter (HOSPITAL_COMMUNITY): Payer: Self-pay

## 2018-09-29 ENCOUNTER — Inpatient Hospital Stay (HOSPITAL_COMMUNITY)
Admission: AD | Admit: 2018-09-29 | Discharge: 2018-10-01 | DRG: 784 | Disposition: A | Discharge status: Home / Self Care | Payer: Medicaid Other | Attending: Obstetrics and Gynecology | Admitting: Obstetrics and Gynecology

## 2018-09-29 ENCOUNTER — Encounter (HOSPITAL_COMMUNITY)
Admission: AD | Disposition: A | Discharge status: Home / Self Care | Payer: Self-pay | Source: Home / Self Care | Attending: Obstetrics and Gynecology

## 2018-09-29 DIAGNOSIS — Z302 Encounter for sterilization: Secondary | ICD-10-CM

## 2018-09-29 DIAGNOSIS — Z98891 History of uterine scar from previous surgery: Secondary | ICD-10-CM

## 2018-09-29 DIAGNOSIS — O9081 Anemia of the puerperium: Secondary | ICD-10-CM | POA: Diagnosis not present

## 2018-09-29 DIAGNOSIS — O34211 Maternal care for low transverse scar from previous cesarean delivery: Secondary | ICD-10-CM | POA: Diagnosis not present

## 2018-09-29 DIAGNOSIS — Z789 Other specified health status: Secondary | ICD-10-CM | POA: Diagnosis present

## 2018-09-29 DIAGNOSIS — O09529 Supervision of elderly multigravida, unspecified trimester: Secondary | ICD-10-CM

## 2018-09-29 DIAGNOSIS — R52 Pain, unspecified: Secondary | ICD-10-CM | POA: Diagnosis not present

## 2018-09-29 DIAGNOSIS — Z1159 Encounter for screening for other viral diseases: Secondary | ICD-10-CM | POA: Diagnosis not present

## 2018-09-29 DIAGNOSIS — Z3A38 38 weeks gestation of pregnancy: Secondary | ICD-10-CM

## 2018-09-29 DIAGNOSIS — O9912 Other diseases of the blood and blood-forming organs and certain disorders involving the immune mechanism complicating childbirth: Secondary | ICD-10-CM | POA: Diagnosis not present

## 2018-09-29 DIAGNOSIS — D6959 Other secondary thrombocytopenia: Secondary | ICD-10-CM | POA: Diagnosis not present

## 2018-09-29 DIAGNOSIS — O4292 Full-term premature rupture of membranes, unspecified as to length of time between rupture and onset of labor: Principal | ICD-10-CM | POA: Diagnosis present

## 2018-09-29 DIAGNOSIS — Z8759 Personal history of other complications of pregnancy, childbirth and the puerperium: Secondary | ICD-10-CM

## 2018-09-29 DIAGNOSIS — D696 Thrombocytopenia, unspecified: Secondary | ICD-10-CM | POA: Diagnosis present

## 2018-09-29 DIAGNOSIS — R1084 Generalized abdominal pain: Secondary | ICD-10-CM | POA: Diagnosis not present

## 2018-09-29 DIAGNOSIS — D582 Other hemoglobinopathies: Secondary | ICD-10-CM | POA: Diagnosis present

## 2018-09-29 LAB — CBC
HCT: 37.3 % (ref 36.0–46.0)
Hemoglobin: 12.1 g/dL (ref 12.0–15.0)
MCH: 29.3 pg (ref 26.0–34.0)
MCHC: 32.4 g/dL (ref 30.0–36.0)
MCV: 90.3 fL (ref 80.0–100.0)
Platelets: ADEQUATE 10*3/uL (ref 150–400)
RBC: 4.13 MIL/uL (ref 3.87–5.11)
RDW: 15.2 % (ref 11.5–15.5)
WBC: 12.8 10*3/uL — ABNORMAL HIGH (ref 4.0–10.5)
nRBC: 0 % (ref 0.0–0.2)

## 2018-09-29 LAB — POCT FERN TEST: POCT Fern Test: POSITIVE

## 2018-09-29 LAB — SARS CORONAVIRUS 2 BY RT PCR (HOSPITAL ORDER, PERFORMED IN ~~LOC~~ HOSPITAL LAB): SARS Coronavirus 2: NEGATIVE

## 2018-09-29 LAB — PLATELET COUNT: Platelets: 109 10*3/uL — ABNORMAL LOW (ref 150–400)

## 2018-09-29 LAB — PREPARE RBC (CROSSMATCH)

## 2018-09-29 SURGERY — Surgical Case
Anesthesia: Spinal

## 2018-09-29 MED ORDER — COCONUT OIL OIL: 1.0000 "application " | TOPICAL_OIL | Status: DC | PRN

## 2018-09-29 MED ORDER — SODIUM CHLORIDE 0.9 % IV SOLN
INTRAVENOUS | Status: DC | PRN
  Administered 2018-09-29: 16:00:00 via INTRAVENOUS

## 2018-09-29 MED ORDER — SODIUM CHLORIDE 0.9 % IV SOLN: 2.0000 g | INTRAVENOUS | Status: DC

## 2018-09-29 MED ORDER — PHENYLEPHRINE HCL-NACL 20-0.9 MG/250ML-% IV SOLN
INTRAVENOUS | Status: DC | PRN
  Administered 2018-09-29: 60 ug/min via INTRAVENOUS

## 2018-09-29 MED ORDER — BUPIVACAINE IN DEXTROSE 0.75-8.25 % IT SOLN
INTRATHECAL | Status: DC | PRN
  Administered 2018-09-29: 1.6 mg via INTRATHECAL

## 2018-09-29 MED ORDER — SOD CITRATE-CITRIC ACID 500-334 MG/5ML PO SOLN
30.0000 mL | ORAL | Status: AC
  Administered 2018-09-29: 30 mL via ORAL
  Filled 2018-09-29: qty 30

## 2018-09-29 MED ORDER — DIPHENHYDRAMINE HCL 50 MG/ML IJ SOLN: 12.5000 mg | INTRAMUSCULAR | Status: DC | PRN

## 2018-09-29 MED ORDER — DIPHENHYDRAMINE HCL 25 MG PO CAPS: 25.0000 mg | ORAL_CAPSULE | Freq: Four times a day (QID) | ORAL | Status: DC | PRN

## 2018-09-29 MED ORDER — SIMETHICONE 80 MG PO CHEW
80.0000 mg | CHEWABLE_TABLET | ORAL | Status: DC | PRN
  Filled 2018-09-29: qty 1

## 2018-09-29 MED ORDER — OXYTOCIN 40 UNITS IN NORMAL SALINE INFUSION - SIMPLE MED
INTRAVENOUS | Status: AC
  Filled 2018-09-29: qty 1000

## 2018-09-29 MED ORDER — GLYCOPYRROLATE PF 0.2 MG/ML IJ SOSY
PREFILLED_SYRINGE | INTRAMUSCULAR | Status: AC
  Filled 2018-09-29: qty 1

## 2018-09-29 MED ORDER — ACETAMINOPHEN 325 MG PO TABS: 325.0000 mg | ORAL_TABLET | Freq: Once | ORAL | Status: DC | PRN

## 2018-09-29 MED ORDER — ONDANSETRON HCL 4 MG/2ML IJ SOLN
INTRAMUSCULAR | Status: AC
  Filled 2018-09-29: qty 2

## 2018-09-29 MED ORDER — IBUPROFEN 800 MG PO TABS
800.0000 mg | ORAL_TABLET | Freq: Four times a day (QID) | ORAL | Status: DC
  Administered 2018-09-30 – 2018-10-01 (×3): 800 mg via ORAL
  Filled 2018-09-29 (×4): qty 1

## 2018-09-29 MED ORDER — LACTATED RINGERS IV SOLN: INTRAVENOUS | Status: DC

## 2018-09-29 MED ORDER — SODIUM CHLORIDE 0.9 % IR SOLN
Status: DC | PRN
  Administered 2018-09-29: 1

## 2018-09-29 MED ORDER — ACETAMINOPHEN 10 MG/ML IV SOLN
1000.0000 mg | Freq: Once | INTRAVENOUS | Status: DC | PRN
  Administered 2018-09-29: 1000 mg via INTRAVENOUS

## 2018-09-29 MED ORDER — SIMETHICONE 80 MG PO CHEW
80.0000 mg | CHEWABLE_TABLET | Freq: Three times a day (TID) | ORAL | Status: DC
  Administered 2018-09-29 – 2018-10-01 (×5): 80 mg via ORAL
  Filled 2018-09-29 (×5): qty 1

## 2018-09-29 MED ORDER — ACETAMINOPHEN 160 MG/5ML PO SOLN: 325.0000 mg | Freq: Once | ORAL | Status: DC | PRN

## 2018-09-29 MED ORDER — MORPHINE SULFATE (PF) 0.5 MG/ML IJ SOLN
INTRAMUSCULAR | Status: DC | PRN
  Administered 2018-09-29: .15 mg via INTRATHECAL

## 2018-09-29 MED ORDER — PHENYLEPHRINE HCL-NACL 20-0.9 MG/250ML-% IV SOLN
INTRAVENOUS | Status: AC
  Filled 2018-09-29: qty 250

## 2018-09-29 MED ORDER — DEXAMETHASONE SODIUM PHOSPHATE 10 MG/ML IJ SOLN
INTRAMUSCULAR | Status: DC | PRN
  Administered 2018-09-29: 10 mg via INTRAVENOUS

## 2018-09-29 MED ORDER — DIBUCAINE (PERIANAL) 1 % EX OINT: 1.0000 "application " | TOPICAL_OINTMENT | CUTANEOUS | Status: DC | PRN

## 2018-09-29 MED ORDER — NALBUPHINE HCL 10 MG/ML IJ SOLN: 5.0000 mg | INTRAMUSCULAR | Status: DC | PRN

## 2018-09-29 MED ORDER — ACETAMINOPHEN 10 MG/ML IV SOLN
INTRAVENOUS | Status: AC
  Filled 2018-09-29: qty 100

## 2018-09-29 MED ORDER — NALOXONE HCL 4 MG/10ML IJ SOLN
1.0000 ug/kg/h | INTRAVENOUS | Status: DC | PRN
  Filled 2018-09-29: qty 5

## 2018-09-29 MED ORDER — METOCLOPRAMIDE HCL 5 MG/ML IJ SOLN
INTRAMUSCULAR | Status: AC
  Filled 2018-09-29: qty 2

## 2018-09-29 MED ORDER — TRANEXAMIC ACID-NACL 1000-0.7 MG/100ML-% IV SOLN: 1000.0000 mg | INTRAVENOUS | Status: DC

## 2018-09-29 MED ORDER — PRENATAL MULTIVITAMIN CH
1.0000 | ORAL_TABLET | Freq: Every day | ORAL | Status: DC
  Administered 2018-09-30: 12:00:00 1 via ORAL
  Filled 2018-09-29 (×2): qty 1

## 2018-09-29 MED ORDER — FENTANYL CITRATE (PF) 100 MCG/2ML IJ SOLN
INTRAMUSCULAR | Status: DC | PRN
  Administered 2018-09-29: 15 ug via INTRATHECAL

## 2018-09-29 MED ORDER — METOCLOPRAMIDE HCL 5 MG/ML IJ SOLN
INTRAMUSCULAR | Status: DC | PRN
  Administered 2018-09-29 (×2): 5 mg via INTRAVENOUS

## 2018-09-29 MED ORDER — ONDANSETRON HCL 4 MG/2ML IJ SOLN
INTRAMUSCULAR | Status: DC | PRN
  Administered 2018-09-29: 4 mg via INTRAVENOUS

## 2018-09-29 MED ORDER — MEPERIDINE HCL 25 MG/ML IJ SOLN
6.2500 mg | INTRAMUSCULAR | Status: DC | PRN
  Administered 2018-09-29: 12.5 mg via INTRAVENOUS

## 2018-09-29 MED ORDER — KETOROLAC TROMETHAMINE 30 MG/ML IJ SOLN
30.0000 mg | Freq: Four times a day (QID) | INTRAMUSCULAR | Status: AC
  Administered 2018-09-29 – 2018-09-30 (×3): 30 mg via INTRAVENOUS
  Filled 2018-09-29 (×3): qty 1

## 2018-09-29 MED ORDER — DIPHENHYDRAMINE HCL 25 MG PO CAPS: 25.0000 mg | ORAL_CAPSULE | ORAL | Status: DC | PRN

## 2018-09-29 MED ORDER — MENTHOL 3 MG MT LOZG: 1.0000 | LOZENGE | OROMUCOSAL | Status: DC | PRN

## 2018-09-29 MED ORDER — SENNOSIDES-DOCUSATE SODIUM 8.6-50 MG PO TABS
2.0000 | ORAL_TABLET | ORAL | Status: DC
  Administered 2018-09-29 – 2018-09-30 (×2): 2 via ORAL
  Filled 2018-09-29 (×2): qty 2

## 2018-09-29 MED ORDER — KETOROLAC TROMETHAMINE 30 MG/ML IJ SOLN
30.0000 mg | Freq: Four times a day (QID) | INTRAMUSCULAR | Status: AC | PRN
  Administered 2018-09-29: 30 mg via INTRAMUSCULAR

## 2018-09-29 MED ORDER — ENOXAPARIN SODIUM 40 MG/0.4ML ~~LOC~~ SOLN
40.0000 mg | SUBCUTANEOUS | Status: DC
  Administered 2018-09-30 – 2018-10-01 (×2): 40 mg via SUBCUTANEOUS
  Filled 2018-09-29 (×2): qty 0.4

## 2018-09-29 MED ORDER — MEPERIDINE HCL 25 MG/ML IJ SOLN: 6.2500 mg | INTRAMUSCULAR | Status: DC | PRN

## 2018-09-29 MED ORDER — SODIUM CHLORIDE 0.9% FLUSH: 3.0000 mL | INTRAVENOUS | Status: DC | PRN

## 2018-09-29 MED ORDER — GABAPENTIN 300 MG PO CAPS: 300.0000 mg | ORAL_CAPSULE | ORAL | Status: DC

## 2018-09-29 MED ORDER — NALBUPHINE HCL 10 MG/ML IJ SOLN: 5.0000 mg | Freq: Once | INTRAMUSCULAR | Status: DC | PRN

## 2018-09-29 MED ORDER — TETANUS-DIPHTH-ACELL PERTUSSIS 5-2.5-18.5 LF-MCG/0.5 IM SUSP: 0.5000 mL | Freq: Once | INTRAMUSCULAR | Status: DC

## 2018-09-29 MED ORDER — ACETAMINOPHEN 325 MG PO TABS
650.0000 mg | ORAL_TABLET | ORAL | Status: DC | PRN
  Administered 2018-09-30 – 2018-10-01 (×2): 650 mg via ORAL
  Filled 2018-09-29 (×2): qty 2

## 2018-09-29 MED ORDER — MORPHINE SULFATE (PF) 0.5 MG/ML IJ SOLN
INTRAMUSCULAR | Status: AC
  Filled 2018-09-29: qty 10

## 2018-09-29 MED ORDER — KETOROLAC TROMETHAMINE 30 MG/ML IJ SOLN: 30.0000 mg | Freq: Four times a day (QID) | INTRAMUSCULAR | Status: AC | PRN

## 2018-09-29 MED ORDER — FAMOTIDINE IN NACL 20-0.9 MG/50ML-% IV SOLN
20.0000 mg | Freq: Once | INTRAVENOUS | Status: AC
  Administered 2018-09-29: 20 mg via INTRAVENOUS
  Filled 2018-09-29: qty 50

## 2018-09-29 MED ORDER — NALOXONE HCL 0.4 MG/ML IJ SOLN: 0.4000 mg | INTRAMUSCULAR | Status: DC | PRN

## 2018-09-29 MED ORDER — MEPERIDINE HCL 25 MG/ML IJ SOLN
INTRAMUSCULAR | Status: AC
  Filled 2018-09-29: qty 1

## 2018-09-29 MED ORDER — GLYCOPYRROLATE 0.2 MG/ML IJ SOLN
INTRAMUSCULAR | Status: DC | PRN
  Administered 2018-09-29: 0.2 mg via INTRAVENOUS

## 2018-09-29 MED ORDER — FENTANYL CITRATE (PF) 100 MCG/2ML IJ SOLN
INTRAMUSCULAR | Status: AC
  Filled 2018-09-29: qty 2

## 2018-09-29 MED ORDER — TERBUTALINE SULFATE 1 MG/ML IJ SOLN: 0.2500 mg | Freq: Once | INTRAMUSCULAR | Status: DC

## 2018-09-29 MED ORDER — SODIUM CHLORIDE 0.9 % IV SOLN
500.0000 mg | Freq: Once | INTRAVENOUS | Status: AC
  Administered 2018-09-29: 500 mg via INTRAVENOUS
  Filled 2018-09-29: qty 500

## 2018-09-29 MED ORDER — PROMETHAZINE HCL 25 MG/ML IJ SOLN: 6.2500 mg | INTRAMUSCULAR | Status: DC | PRN

## 2018-09-29 MED ORDER — FENTANYL CITRATE (PF) 100 MCG/2ML IJ SOLN: 25.0000 ug | INTRAMUSCULAR | Status: DC | PRN

## 2018-09-29 MED ORDER — OXYCODONE HCL 5 MG PO TABS: 5.0000 mg | ORAL_TABLET | ORAL | Status: DC | PRN

## 2018-09-29 MED ORDER — DEXAMETHASONE SODIUM PHOSPHATE 10 MG/ML IJ SOLN
INTRAMUSCULAR | Status: AC
  Filled 2018-09-29: qty 1

## 2018-09-29 MED ORDER — TRANEXAMIC ACID-NACL 1000-0.7 MG/100ML-% IV SOLN
INTRAVENOUS | Status: AC
  Filled 2018-09-29: qty 100

## 2018-09-29 MED ORDER — ONDANSETRON HCL 4 MG/2ML IJ SOLN: 4.0000 mg | Freq: Three times a day (TID) | INTRAMUSCULAR | Status: DC | PRN

## 2018-09-29 MED ORDER — LACTATED RINGERS IV SOLN
INTRAVENOUS | Status: DC
  Administered 2018-09-29 (×3): via INTRAVENOUS

## 2018-09-29 MED ORDER — ACETAMINOPHEN 500 MG PO TABS: 1000.0000 mg | ORAL_TABLET | ORAL | Status: DC

## 2018-09-29 MED ORDER — SCOPOLAMINE 1 MG/3DAYS TD PT72: 1.0000 | MEDICATED_PATCH | Freq: Once | TRANSDERMAL | Status: DC

## 2018-09-29 MED ORDER — CEFAZOLIN SODIUM-DEXTROSE 2-4 GM/100ML-% IV SOLN
2.0000 g | Freq: Once | INTRAVENOUS | Status: AC
  Administered 2018-09-29: 15:00:00 2 g via INTRAVENOUS
  Filled 2018-09-29: qty 100

## 2018-09-29 MED ORDER — KETOROLAC TROMETHAMINE 30 MG/ML IJ SOLN
INTRAMUSCULAR | Status: AC
  Filled 2018-09-29: qty 1

## 2018-09-29 MED ORDER — SODIUM CHLORIDE 0.9 % IV SOLN
INTRAVENOUS | Status: DC | PRN
  Administered 2018-09-29: 16:00:00 40 [IU] via INTRAVENOUS

## 2018-09-29 MED ORDER — OXYTOCIN 40 UNITS IN NORMAL SALINE INFUSION - SIMPLE MED: 2.5000 [IU]/h | INTRAVENOUS | Status: AC

## 2018-09-29 MED ORDER — WITCH HAZEL-GLYCERIN EX PADS: 1.0000 "application " | MEDICATED_PAD | CUTANEOUS | Status: DC | PRN

## 2018-09-29 SURGICAL SUPPLY — 37 items
BENZOIN TINCTURE PRP APPL 2/3 (GAUZE/BANDAGES/DRESSINGS) ×2 IMPLANT
CHLORAPREP W/TINT 26ML (MISCELLANEOUS) ×3 IMPLANT
CLAMP CORD UMBIL (MISCELLANEOUS) IMPLANT
CLOSURE STERI-STRIP 1/2X4 (GAUZE/BANDAGES/DRESSINGS) ×1
CLOTH BEACON ORANGE TIMEOUT ST (SAFETY) ×3 IMPLANT
CLSR STERI-STRIP ANTIMIC 1/2X4 (GAUZE/BANDAGES/DRESSINGS) ×1 IMPLANT
DRSG OPSITE POSTOP 4X10 (GAUZE/BANDAGES/DRESSINGS) ×3 IMPLANT
ELECT REM PT RETURN 9FT ADLT (ELECTROSURGICAL) ×3
ELECTRODE REM PT RTRN 9FT ADLT (ELECTROSURGICAL) ×1 IMPLANT
EXTRACTOR VACUUM M CUP 4 TUBE (SUCTIONS) IMPLANT
EXTRACTOR VACUUM M CUP 4' TUBE (SUCTIONS)
GLOVE BIOGEL PI IND STRL 7.0 (GLOVE) ×3 IMPLANT
GLOVE BIOGEL PI INDICATOR 7.0 (GLOVE) ×6
GLOVE ECLIPSE 7.0 STRL STRAW (GLOVE) ×3 IMPLANT
GOWN STRL REUS W/TWL LRG LVL3 (GOWN DISPOSABLE) ×6 IMPLANT
KIT ABG SYR 3ML LUER SLIP (SYRINGE) IMPLANT
NDL HYPO 25X5/8 SAFETYGLIDE (NEEDLE) ×1 IMPLANT
NEEDLE HYPO 22GX1.5 SAFETY (NEEDLE) ×3 IMPLANT
NEEDLE HYPO 25X5/8 SAFETYGLIDE (NEEDLE) ×3 IMPLANT
NS IRRIG 1000ML POUR BTL (IV SOLUTION) ×3 IMPLANT
PACK C SECTION WH (CUSTOM PROCEDURE TRAY) ×3 IMPLANT
PAD ABD 7.5X8 STRL (GAUZE/BANDAGES/DRESSINGS) ×3 IMPLANT
PAD OB MATERNITY 4.3X12.25 (PERSONAL CARE ITEMS) ×3 IMPLANT
PENCIL SMOKE EVAC W/HOLSTER (ELECTROSURGICAL) ×3 IMPLANT
RTRCTR C-SECT PINK 25CM LRG (MISCELLANEOUS) IMPLANT
SUT CHROMIC 1 CTX 36 (SUTURE) ×2 IMPLANT
SUT PDS AB 0 CTX 36 PDP370T (SUTURE) IMPLANT
SUT PLAIN 2 0 XLH (SUTURE) IMPLANT
SUT VIC AB 0 CTX 36 (SUTURE) ×4
SUT VIC AB 0 CTX36XBRD ANBCTRL (SUTURE) ×2 IMPLANT
SUT VIC AB 2-0 CT1 27 (SUTURE) ×6
SUT VIC AB 2-0 CT1 TAPERPNT 27 (SUTURE) IMPLANT
SUT VIC AB 4-0 KS 27 (SUTURE) ×5 IMPLANT
SYR CONTROL 10ML LL (SYRINGE) ×3 IMPLANT
TOWEL OR 17X24 6PK STRL BLUE (TOWEL DISPOSABLE) ×3 IMPLANT
TRAY FOLEY W/BAG SLVR 14FR LF (SET/KITS/TRAYS/PACK) ×3 IMPLANT
WATER STERILE IRR 1000ML POUR (IV SOLUTION) ×3 IMPLANT

## 2018-09-29 NOTE — Op Note (Addendum)
Operative Note   SURGERY DATE: 09/29/2018  PRE-OP DIAGNOSIS:  *Pregnancy at 38w4 *Spontaneous rupture of membranes *Spontaneous onset of labor *History of prior Cesarean section x2 *Desires permanent sterility   POST-OP DIAGNOSIS: Same.  PROCEDURE: repeat low transverse cesarean section via pfannenstiel skin incision with double layer uterine closure and bilateral tubal ligation via parkland method  SURGEON: Surgeon(s) and Role:    Ilda Basset, Eduard Clos, MD - Primary    * Juleen China, Laurel S, DO - Assisting  ANESTHESIA: spinal ESTIMATED BLOOD LOSS: 201cc per Triton DRAINS: 315mL UOP via indwelling foley TOTAL IV FLUIDS: 2458mL crystalloid VTE PROPHYLAXIS: SCDs to bilateral lower extremities ANTIBIOTICS: Two grams of Cefazolin were given., within 1 hour of skin incision. 500mg  of azithromycin given just prior to incision.  SPECIMENS: portion of left and portion of right fallopian tubes COMPLICATIONS: none immediate  FINDINGS: Of note, there were no intra-abdominal adhesions were noted. Grossly normal uterus, tubes and ovaries. Clear amniotic fluid, cephalic female infant, 1610RU, APGARs 8/10, intact placenta.  PROCEDURE IN DETAIL: The patient was taken to the operating room where anesthesia was administered and normal fetal heart tones were confirmed. She was then prepped and draped in the normal fashion in the dorsal supine position with a leftward tilt.  After a time out was performed, a pfannensteil skin incision just above her prior incision was made with the scalpel and carried through to the underlying layer of fascia. The fascia was then incised at the midline and this incision was extended laterally with the mayo scissors. Attention was turned to the superior aspect of the fascial incision which was grasped with the kocher clamps x 2, tented up and the rectus muscles were dissected off with the knife. The rectus muscles were then separated in the midline and the peritoneum was entered  bluntly. The bladder blade was inserted and the vesicouterine peritoneum was identified, tented up and entered with the metzenbaum scissors. The lower uterine segment was thin, and the amniotic sac was encountered.   A low transverse hysterotomy was extended with the scalpel then bluntly entered yielding clear amniotic fluid. This incision was extended bluntly and the infant's head, shoulders and body were delivered atraumatically.The cord was clamped x 2 and cut, and the infant was handed to the awaiting pediatricians, after delayed cord clamping was done.  The placenta was then gradually expressed from the uterus and then the uterus was exteriorized and cleared of all clots and debris. The hysterotomy was repaired with a running suture of 1-0 Monocryl. A second imbricating layer of 1-0 Monocryl suture was then placed.   [place]: The right Fallopian tube was identified by tracing out to the fimbrae, grasped with the Babcock clamps. An avascular midsection of the tube approximately 3-4cm from the cornua was grasped with the babcock clamps. A window was created with the Bovie. The distal and proximal aspects were ligated with a suture of 2-0 Vicryl twice, with the intervening portion of tube was transected and removed, via the Metzenbaum scissors.  Attention was then turned to the left fallopian tube after confirmation by tracing the tube out to the fimbriae. The same procedure was then performed on the left Fallopian tube, with excellent hemostasis was noted from both BTL sites.   The uterus and adnexa were then returned to the abdomen, and the hysterotomy and all operative sites were reinspected and excellent hemostasis was noted after irrigation and suction of the abdomen with warm saline.  The fascia was reapproximated with 0 Vicryl in a  simple running fashion bilaterally. The subcutaneous layer was then reapproximated with interrupted sutures of 2-0 plain gut, and the skin was then closed with 4-0  monocryl, in a subcuticular fashion.  The patient  tolerated the procedure well. Sponge, lap, needle, and instrument counts were correct x 3. The patient was transferred to the recovery room awake, alert and breathing independently in stable condition.  Cristal DeerLaurel S. Earlene PlaterWallace, DO OB/GYN Fellow Center for [facility](Faculty Practice)  Agree with above. I was present and scrubbed for the entire procedure.   Cornelia Copaharlie Laelynn Blizzard, Jr MD Attending Center for [facility](Faculty Practice)

## 2018-09-29 NOTE — Anesthesia Procedure Notes (Signed)
Spinal  Start time: 09/29/2018 3:28 PM End time: 09/29/2018 3:30 PM Staffing Anesthesiologist: Effie Berkshire, MD Performed: anesthesiologist  Preanesthetic Checklist Completed: patient identified, site marked, surgical consent, pre-op evaluation, timeout performed, IV checked, risks and benefits discussed and monitors and equipment checked Spinal Block Patient position: sitting Prep: site prepped and draped and DuraPrep Location: L3-4 Injection technique: single-shot Needle Needle type: Pencan  Needle gauge: 24 G Needle length: 10 cm Needle insertion depth: 10 cm Additional Notes Patient tolerated well. No immediate complications.

## 2018-09-29 NOTE — Anesthesia Preprocedure Evaluation (Addendum)
Anesthesia Evaluation  Patient identified by MRN, date of birth, ID band Patient awake    Reviewed: Allergy & Precautions, NPO status , Patient's Chart, lab work & pertinent test results  Airway Mallampati: III  TM Distance: >3 FB Neck ROM: Full    Dental  (+) Teeth Intact, Dental Advisory Given   Pulmonary neg pulmonary ROS,    breath sounds clear to auscultation       Cardiovascular hypertension,  Rhythm:Regular Rate:Normal     Neuro/Psych negative neurological ROS     GI/Hepatic negative GI ROS, Neg liver ROS,   Endo/Other  negative endocrine ROS  Renal/GU negative Renal ROS     Musculoskeletal negative musculoskeletal ROS (+)   Abdominal Normal abdominal exam  (+)   Peds  Hematology negative hematology ROS (+)   Anesthesia Other Findings   Reproductive/Obstetrics                            Anesthesia Physical Anesthesia Plan  ASA: II  Anesthesia Plan: Spinal   Post-op Pain Management:    Induction:   PONV Risk Score and Plan: Ondansetron and Treatment may vary due to age or medical condition  Airway Management Planned: Natural Airway  Additional Equipment: None  Intra-op Plan:   Post-operative Plan:   Informed Consent: I have reviewed the patients History and Physical, chart, labs and discussed the procedure including the risks, benefits and alternatives for the proposed anesthesia with the patient or authorized representative who has indicated his/her understanding and acceptance.       Plan Discussed with: CRNA  Anesthesia Plan Comments: (Lab Results      Component                Value               Date                      WBC                      12.8 (H)            09/29/2018                HGB                      12.1                09/29/2018                HCT                      37.3                09/29/2018                MCV                      90.3                 09/29/2018                PLT                      109 (L)             09/29/2018  t                Value               Date                      [id number]              NEGATIVE            09/29/2018            )       Anesthesia Quick Evaluation

## 2018-09-29 NOTE — Progress Notes (Signed)
Lab called and they said plt count in progress

## 2018-09-29 NOTE — Telephone Encounter (Signed)
Preadmission screen Interpreter number [phone]

## 2018-09-29 NOTE — Transfer of Care (Signed)
Immediate Anesthesia Transfer of Care Note  Patient: Kristine Silva  Procedure(s) Performed: CESAREAN SECTION (N/A )  Patient Location: PACU  Anesthesia Type:Spinal  Level of Consciousness: awake and sedated  Airway & Oxygen Therapy: Patient Spontanous Breathing  Post-op Assessment: Report given to RN  Post vital signs: Reviewed and stable  Last Vitals:  Vitals Value Taken Time  BP    Temp    Pulse 67 09/29/18 1645  Resp 16 09/29/18 1645  SpO2 96 % 09/29/18 1645  Vitals shown include unvalidated device data.  Last Pain: There were no vitals filed for this visit.       Complications: No apparent anesthesia complications

## 2018-09-29 NOTE — Plan of Care (Signed)
  Problem: Education: Goal: Knowledge of General Education information will improve Description: Including pain rating scale, medication(s)/side effects and non-pharmacologic comfort measures Outcome: Completed/Met   Problem: Nutrition: Goal: Adequate nutrition will be maintained Outcome: Completed/Met

## 2018-09-29 NOTE — Progress Notes (Signed)
N[place]ali Interpreter "Mila" [phone] used via [employer] interpreter.   Utilized interpreter for pre-procedural time out and throughout the procedure.  Plan of care verbalized to patient. All questions of patient answered.  Patient expresses happiness with delivery of new baby.

## 2018-09-29 NOTE — MAU Note (Signed)
Called OB team, House Coverage, Anesthesia, OB OR RN, CRNA.

## 2018-09-29 NOTE — Discharge Summary (Addendum)
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 [redacted]w[redacted]d who was admitted for repeat LTCS in setting of SROM/SOL. Her pregnancy was complicated by above noted. Delivery was uncomplicated. Please see delivery/op note for additional details. Her postpartum course was uncomplicated. She was breastfeeding and bottlefeeding without difficulty. By day of discharge, she was passing flatus, urinating, eating and drinking without difficulty. Her pain was well-controlled, and she was discharged home with ibuprofen and oxycodone for pain, as well as ferrous sulfate for her anemia. She will follow-up in clinic in 4 weeks.   Physical exam  Vitals:   09/30/18 1002 09/30/18 1342 09/30/18 2236 10/01/18 0455  BP: 115/76 113/60 111/62 115/62  Pulse: (!) 59 (!) 50 (!) 59 (!) 57  Resp: 18 18  16   Temp: 99.5 F (37.5 C) 98.2 F (36.8 C) 98 F (36.7 C) 98.2 F (36.8 C)  TempSrc: Oral Oral Oral Oral  SpO2: 98% 96% 96% 99%   General: well appearing, NAD Lochia: appropriate Uterine Fundus: firm Incision: Healing well with no significant drainage, No significant erythema, Dressing is clean, dry, and intact DVT Evaluation: No evidence of  DVT seen on physical exam. No cords or calf tenderness. No significant calf/ankle edema.  Labs: Lab Results  Component Value Date   WBC 10.6 (H) 10/01/2018   HGB 9.9 (L) 10/01/2018   HCT 30.3 (L) 10/01/2018   MCV 91.0 10/01/2018   PLT 78 (L) 10/01/2018   CMP Latest Ref Rng & Units 09/30/2018  Glucose 70 - 99 mg/dL -  BUN 6 - 20 mg/dL -  Creatinine 0.44 - 1.00 mg/dL 0.72  Sodium 135 - 145 mmol/L -  Potassium 3.5 - 5.1 mmol/L -  Chloride 98 - 111 mmol/L -  CO2 22 - 32 mmol/L -  Calcium 8.9 - 10.3 mg/dL -  Total Protein 6.5 - 8.1 g/dL -  Total Bilirubin 0.3 - 1.2 mg/dL -  Alkaline Phos 38 - 126 U/L -  AST 15 - 41 U/L -  ALT 0 - 44 U/L -    Discharge instructions: Per After Visit Summary and "Baby and Me Booklet"  After visit meds:  Allergies as of 10/01/2018   No Known Allergies     Medication List    TAKE these medications   FeroSul 325 (65 FE) MG tablet Generic drug: ferrous sulfate Take 325 mg by mouth 2 (two) times a day.   ibuprofen 800 MG tablet Commonly known as: ADVIL Take 1 tablet (800 mg total) by mouth every 6 (six) hours.   oxyCODONE 5 MG immediate release tablet  Commonly known as: Oxy IR/ROXICODONE Take 1-2 tablets (5-10 mg total) by mouth every 4 (four) hours as needed for moderate pain.   Prenatal 27-1 MG Tabs Take 1 tablet by mouth daily.   senna-docusate 8.6-50 MG tablet Commonly known as: Senokot-S Take 2 tablets by mouth daily. Start taking on: October 02, 2018       Postpartum contraception: Tubal Ligation Diet: Routine Diet Activity: Advance as tolerated. Pelvic rest for 6 weeks.   Follow-up Appt: Future Appointments  Date Time Provider Department Center  10/13/2018  9:20 AM [facility] NURSE [facility]  10/27/2018  2:55 PM Willodean RosenthalHarraway-Smith, Carolyn, MD [facility]   Please schedule this patient for Postpartum visit in: 4-6 weeks with the following provider: Any provider For C/S patients schedule nurse incision check in weeks 2 weeks:  yes Low risk pregnancy complicated by: AMA, hx gHTN Delivery mode:  CS Anticipated Birth Control:  BTL done PP PP Procedures needed: Incision check  Schedule Integrated BH visit: no  Newborn Data: Live born female  Birth Weight: 6 lb 13.5 oz (3104 g) APGAR: 8, 10  Newborn Delivery   Birth date/time: 09/29/2018 15:51:00 Delivery type: C-Section, Low Transverse Trial of labor: No C-section categorization: Repeat     Baby Feeding: Bottle and Breast Disposition:home with mother  Mart PiggsAlicia Firestone, PGY-III Family Medicine  OB FELLOW DISCHARGE ATTESTATION  I have seen and examined this patient and agree with above documentation in the resident's note.   Marcy Sirenatherine Jaskaran Dauzat, D.O. OB Fellow  10/01/2018, 11:10 AM

## 2018-09-29 NOTE — Anesthesia Postprocedure Evaluation (Signed)
Anesthesia Post Note  Patient: Kristine Silva  Procedure(s) Performed: CESAREAN SECTION (N/A )     Patient location during evaluation: PACU Anesthesia Type: Spinal Level of consciousness: oriented and awake and alert Pain management: pain level controlled Vital Signs Assessment: post-procedure vital signs reviewed and stable Respiratory status: spontaneous breathing, respiratory function stable and patient connected to nasal cannula oxygen Cardiovascular status: blood pressure returned to baseline and stable Postop Assessment: no headache, no backache, no apparent nausea or vomiting and spinal receding Anesthetic complications: no    Last Vitals:  Vitals:   09/29/18 1731 09/29/18 1746  BP: (!) 89/69 (!) 83/57  Pulse:    Resp:    Temp:    SpO2:      Last Pain:  Vitals:   09/29/18 1731  TempSrc:   PainSc: 0-No pain   Pain Goal:                Epidural/Spinal Function Cutaneous sensation: Able to Wiggle Toes (09/29/18 1731), Patient able to flex knees: No (09/29/18 1731), Patient able to lift hips off bed: No (09/29/18 1731), Back pain beyond tenderness at insertion site: No (09/29/18 1731), Progressively worsening motor and/or sensory loss: No (09/29/18 1731), Bowel and/or bladder incontinence post epidural: No (09/29/18 1731)  Effie Berkshire

## 2018-09-29 NOTE — H&P (Signed)
Obstetrics Admission History & Physical  09/29/2018 - 12:45 PM Primary OBGYN: [facility] ([facility] transfer for low platelets)  Chief Complaint: SROM @ 0500 today  History of Present Illness  38 y.o. [id number] @ [redacted]w[redacted]d, with the above CC. Pregnancy complicated by: low plts, HgbE trait, AMA, h/o c-section x 2, h/o gHTN  Kristine Silva with above CC. Feels UCs every few minutes. Cup of water this morning but nothing else aside from that since yesterday  Review of Systems: as noted in the History of Present Illness.   PMHx:  Past Medical History:  Diagnosis Date  . Medical history non-contributory   . Pregnancy induced hypertension   . Tuberculosis    Pt treated with rifampin for TB upon arrival. Pt stopped after a couple of days d/t reaction. second son treated for 1 year.   PSHx:  Past Surgical History:  Procedure Laterality Date  . CESAREAN SECTION     Medications:  Medications Prior to Admission  Medication Sig Dispense Refill Last Dose  . FEROSUL 325 (65 Fe) MG tablet Take 325 mg by mouth 2 (two) times a day.     . Prenatal 27-1 MG TABS Take 1 tablet by mouth daily.        Allergies: has No Known Allergies. OBHx:  OB History  Gravida Para Term Preterm AB Living  3 2 2     2   SAB TAB Ectopic Multiple Live Births          2    # Outcome Date GA Lbr Len/2nd Weight Sex Delivery Anes PTL Lv  3 Current           2 Term 09/07/09    M CS-Unspec   LIV  1 Term 02/06/05    M CS-Unspec   LIV     Complications: Gestational hypertension               FHx: No family history on file. Soc Hx:  Social History   Socioeconomic History  . Marital status: Married    Spouse name: Not on file  . Number of children: Not on file  . Years of education: Not on file  . Highest education level: Not on file  Occupational History  . Not on file  Social Needs  . Financial resource strain: Not on file  . Food insecurity    Worry: Not on file    Inability: Not on file  .  Transportation needs    Medical: Not on file    Non-medical: Not on file  Tobacco Use  . Smoking status: Never Smoker  . Smokeless tobacco: Never Used  Substance and Sexual Activity  . Alcohol use: Not Currently  . Drug use: Never  . Sexual activity: Not on file  Lifestyle  . Physical activity    Days per week: Not on file    Minutes per session: Not on file  . Stress: Not on file  Relationships  . Social Herbalist on phone: Not on file    Gets together: Not on file    Attends religious service: Not on file    Active member of club or organization: Not on file    Attends meetings of clubs or organizations: Not on file    Relationship status: Not on file  . Intimate partner violence    Fear of current or ex partner: Not on file    Emotionally abused: Not on file    Physically abused:  Not on file    Forced sexual activity: Not on file  Other Topics Concern  . Not on file  Social History Narrative  . Not on file    Objective    Current Vital Signs 24h Vital Sign Ranges  T 98.3 F (36.8 C) Temp  Avg: 98.3 F (36.8 C)  Min: 98.3 F (36.8 C)  Max: 98.3 F (36.8 C)  BP 111/84 BP  Min: 111/84  Max: 132/80  HR 96 Pulse  Avg: 94.5  Min: 93  Max: 96  RR 16 Resp  Avg: 16  Min: 16  Max: 16  SaO2     No data recorded       24 Hour I/O Current Shift I/O  Time Ins Outs No intake/output data recorded. No intake/output data recorded.   Category I with accels Toco: q2-3  General: Well nourished, well developed female in no acute distress.  Skin:  Warm and dry.  Cardiovascular: S1, S2 normal, no murmur, rub or gallop, regular rate and rhythm Respiratory:  Clear to auscultation bilateral. Normal respiratory effort Abdomen: gravid Neuro/Psych:  Normal mood and affect.   SVE: per RN 1/80/high  Labs  Pending Last plts on 7/8 were 130k  Radiology Posterior placenta  Perinatal info  O pos/ Rubella  Immune /RPR pending/HIV negative/HepB Surf Ag negative//pap  neg 2020/  Assessment & Plan   38 y.o. [id number] @ 7418w4d with PROM. Pt stable *Pregnancy: maternal fetal status reassuring *PROM: pt would like rpt and BTL. D/w her re: permanency of BTL and she desires this. D/w OR. Can proceed when labs are back. Will give dose of terbutaline in the interim. Will get ancef and azithro given PROM *GBS: unknown. But term and no RFs. Will get swab.  *Analgesia: no current needs  Interpreter used.   Kristine Copaharlie Krisy Dix, Jr. MD Attending Center [facility](Faculty Practice)

## 2018-09-30 ENCOUNTER — Other Ambulatory Visit (HOSPITAL_COMMUNITY)
Admission: RE | Admit: 2018-09-30 | Discharge: 2018-09-30 | Disposition: A | Discharge status: Home / Self Care | Payer: Medicaid Other | Source: Ambulatory Visit

## 2018-09-30 ENCOUNTER — Other Ambulatory Visit: Payer: Self-pay

## 2018-09-30 ENCOUNTER — Encounter: Payer: Medicaid Other | Admitting: Advanced Practice Midwife

## 2018-09-30 LAB — CREATININE, SERUM
Creatinine, Ser: 0.72 mg/dL (ref 0.44–1.00)
GFR calc Af Amer: >60 mL/min (ref >60)
GFR calc non Af Amer: >60 mL/min (ref >60)

## 2018-09-30 LAB — CBC
HCT: 30.4 % — ABNORMAL LOW (ref 36.0–46.0)
Hemoglobin: 9.8 g/dL — ABNORMAL LOW (ref 12.0–15.0)
MCH: 29.6 pg (ref 26.0–34.0)
MCHC: 32.2 g/dL (ref 30.0–36.0)
MCV: 91.8 fL (ref 80.0–100.0)
Platelets: 83 10*3/uL — ABNORMAL LOW (ref 150–400)
RBC: 3.31 MIL/uL — ABNORMAL LOW (ref 3.87–5.11)
RDW: 14.9 % (ref 11.5–15.5)
WBC: 17.6 10*3/uL — ABNORMAL HIGH (ref 4.0–10.5)
nRBC: 0 % (ref 0.0–0.2)

## 2018-09-30 LAB — RPR: RPR Ser Ql: NONREACTIVE

## 2018-09-30 MED ORDER — LACTATED RINGERS IV SOLN
INTRAVENOUS | Status: DC
  Administered 2018-09-30: 07:00:00 via INTRAVENOUS

## 2018-09-30 MED ORDER — FERROUS SULFATE 325 (65 FE) MG PO TABS
325.0000 mg | ORAL_TABLET | Freq: Two times a day (BID) | ORAL | Status: DC
  Administered 2018-09-30 – 2018-10-01 (×3): 325 mg via ORAL
  Filled 2018-09-30 (×3): qty 1

## 2018-09-30 MED ORDER — LACTATED RINGERS IV BOLUS
500.0000 mL | Freq: Once | INTRAVENOUS | Status: AC
  Administered 2018-09-30: 05:00:00 via INTRAVENOUS

## 2018-09-30 NOTE — Progress Notes (Signed)
POSTPARTUM PROGRESS NOTE  Subjective: Kristine Silva is a 38 y.o. [id number] POD#1 s/p rLTCS/BTL at [redacted]w[redacted]d.  She reports she doing well. No acute events overnight. She denies any problems with ambulating, voiding or po intake. Denies nausea or vomiting. She has passed flatus. Pain is well controlled.  Lochia is improving. Foley catheter in place.   Objective: Blood pressure 114/64, pulse (!) 53, temperature 98 F (36.7 C), temperature source Oral, resp. rate 18, last menstrual period 01/12/2018, SpO2 95 %, unknown if currently breastfeeding.  Physical Exam:  General: alert, cooperative and no distress Chest: no respiratory distress Abdomen: soft, non-tender  incision c/d/i  Uterine Fundus: firm, appropriately tender Extremities: No calf swelling or tenderness  no edema  Recent Labs    09/29/18 1246  HGB 12.1  HCT 37.3    Assessment/Plan: Kristine Silva is a 38 y.o. [id number] POD#1 s/p rLTCS/BTL at [redacted]w[redacted]d:  POD#1: Doing well, pain well-controlled. -- Routine postpartum care -- Encouraged up OOB -- Lovenox for VTE prophylaxis  Postpartum anemia --PP CBC 7/28 hgb 9.8 (12.1 on 7/27) --ferrous sulfate 325 mg BID  Thrombocytopenia --pt with known gestational thrombocytopenia --7/28 platelets=83 (109 on 7/27) --no evidence of bleeding, bruising --will continue to monitor, repeat cbc 7/29  Hx of gHTN --Blood pressures stable with this pregnancy --asymptomatic --continue to montior  Routine Postpartum Care -- Contraception: s/p BTL -- Feeding: breast/bottle  Dispo: Plan for continued admission post op to monitor thrombocyotpenia.  Corliss Blacker, [facility]

## 2018-09-30 NOTE — Lactation Note (Signed)
This note was copied from a baby's chart. Lactation Consultation Note  Patient Name: Kristine Silva [id number] Date: 09/30/2018 Reason for consult: Initial assessment;Early term 27-38.6wks  P3 mother whose infant is now 76 hours old.  Mother breast fed her first child for 5 years and her second child for 4 years.  Nepali interpreter [phone]) used for interpretation  Baby was asleep in mother's arms when I arrived.  Mother had no questions/concerns at this time.  Via interpreter mother stated, "I know everything."  Mother stated her breasts/nipples are "fine" and she does not need anything for them.  Offered to assist/observe with latching as needed and mother verbalized understanding.  Mother is familiar with hand expression.  Colostrum container provided for any EBM she obtains with hand expression.  Reminded her to do this before/after feedings to help increase milk supply.  Milk storage times discussed and finger feeding demonstrated.    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  She mentioned that she has applied to [facility] but "never heard back from them."  I suggested she call again to inform them of her desire to apply.  Mother will do this.  She stated, "I forgot" after the last time when they did not return her call.  RN in room for further instructions with the interpreter.   Maternal Data Formula Feeding for Exclusion: No Has patient been taught Hand Expression?: Yes Does the patient have breastfeeding experience prior to this delivery?: Yes  Feeding    LATCH Score                   Interventions    Lactation Tools Discussed/Used WIC Program: No(applied but has not heard back; encouraged to call again)   Consult Status Consult Status: Follow-up Date: 10/01/18 Follow-up type: In-patient    Shalini Mair R Chandler Stofer 09/30/2018, 9:54 AM

## 2018-10-01 ENCOUNTER — Encounter (HOSPITAL_COMMUNITY): Payer: Self-pay | Admitting: Obstetrics and Gynecology

## 2018-10-01 LAB — CBC
HCT: 30.3 % — ABNORMAL LOW (ref 36.0–46.0)
Hemoglobin: 9.9 g/dL — ABNORMAL LOW (ref 12.0–15.0)
MCH: 29.7 pg (ref 26.0–34.0)
MCHC: 32.7 g/dL (ref 30.0–36.0)
MCV: 91 fL (ref 80.0–100.0)
Platelets: 78 10*3/uL — ABNORMAL LOW (ref 150–400)
RBC: 3.33 MIL/uL — ABNORMAL LOW (ref 3.87–5.11)
RDW: 14.8 % (ref 11.5–15.5)
WBC: 10.6 10*3/uL — ABNORMAL HIGH (ref 4.0–10.5)
nRBC: 0 % (ref 0.0–0.2)

## 2018-10-01 MED ORDER — IBUPROFEN 800 MG PO TABS: 800.0000 mg | ORAL_TABLET | Freq: Four times a day (QID) | ORAL | 1 refills | Status: AC

## 2018-10-01 MED ORDER — OXYCODONE HCL 5 MG PO TABS: 5.0000 mg | ORAL_TABLET | ORAL | 0 refills | Status: DC | PRN

## 2018-10-01 MED ORDER — SENNOSIDES-DOCUSATE SODIUM 8.6-50 MG PO TABS: 2.0000 | ORAL_TABLET | ORAL | 0 refills | Status: DC

## 2018-10-02 ENCOUNTER — Inpatient Hospital Stay (HOSPITAL_COMMUNITY)
Admission: RE | Admit: 2018-10-02 | Payer: Medicaid Other | Source: Home / Self Care | Admitting: Obstetrics & Gynecology

## 2018-10-02 LAB — TYPE AND SCREEN
ABO/RH(D): O POS
Antibody Screen: NEGATIVE
Unit division: 0
Unit division: 0

## 2018-10-02 LAB — BPAM RBC
Blood Product Expiration Date: 202008232359
Blood Product Expiration Date: 202008232359
Unit Type and Rh: 5100
Unit Type and Rh: 5100

## 2018-10-09 ENCOUNTER — Encounter: Payer: Medicaid Other | Admitting: Obstetrics & Gynecology

## 2018-10-13 ENCOUNTER — Encounter: Payer: Medicaid Other | Admitting: Obstetrics & Gynecology

## 2018-10-13 ENCOUNTER — Ambulatory Visit (INDEPENDENT_AMBULATORY_CARE_PROVIDER_SITE_OTHER): Payer: Medicaid Other

## 2018-10-13 ENCOUNTER — Other Ambulatory Visit: Payer: Self-pay

## 2018-10-13 VITALS — BP 130/88 | HR 107 | Wt 140.9 lb

## 2018-10-13 DIAGNOSIS — Z5189 Encounter for other specified aftercare: Secondary | ICD-10-CM

## 2018-10-13 NOTE — Progress Notes (Signed)
Pt here today for incision check s/p c-section on 09/29/18.  With Video Interpreter # [id number] Pt denies any bleeding but some mild pain at incision site.  Medications reconciled.  Pt presents with elevated BP 130/99 LA and rpt BP RA 145/95.  Rpt BP via manually 130/88.  Pt denies any sx's HTN. Notified Dr. Roselie Awkward- no new orders.  Pt request refill on her ibuprofen.  Per Dr. Roselie Awkward pt does not need refill on iron.  Ibuprofen e-prescribed.  Pt verbalized understanding.    Mel Almond, RN 10/13/18

## 2018-10-13 NOTE — Patient Instructions (Signed)
Miralax for constipation over the counter

## 2018-10-24 ENCOUNTER — Telehealth: Payer: Self-pay | Admitting: Obstetrics & Gynecology

## 2018-10-24 NOTE — Telephone Encounter (Signed)
Called the patient to inform of the upcoming appointment, received a message the person you are trying to reach can not accept calls at this time.

## 2018-10-27 ENCOUNTER — Other Ambulatory Visit: Payer: Self-pay

## 2018-10-27 ENCOUNTER — Telehealth (INDEPENDENT_AMBULATORY_CARE_PROVIDER_SITE_OTHER): Payer: Medicaid Other | Admitting: Lactation Services

## 2018-10-27 ENCOUNTER — Ambulatory Visit (INDEPENDENT_AMBULATORY_CARE_PROVIDER_SITE_OTHER): Payer: Medicaid Other | Admitting: Family Medicine

## 2018-10-27 ENCOUNTER — Ambulatory Visit: Payer: Medicaid Other | Admitting: Obstetrics & Gynecology

## 2018-10-27 ENCOUNTER — Encounter: Payer: Self-pay | Admitting: Family Medicine

## 2018-10-27 DIAGNOSIS — Z1389 Encounter for screening for other disorder: Secondary | ICD-10-CM

## 2018-10-27 NOTE — Progress Notes (Signed)
Subjective:     Kristine Silva is a 38 y.o. female who presents for a postpartum visit. She is 4 weeks postpartum following a low cervical transverse Cesarean section. I have fully reviewed the prenatal and intrapartum course. The delivery was at 38.4 gestational weeks. Outcome: repeat cesarean section, low transverse incision. Anesthesia: spinal. Postpartum course has been unremarkable. Baby's course has been unremarkable. Baby is feeding by breast. Bleeding no bleeding. Bowel function is normal. Bladder function is normal. Patient is not sexually active. Contraception method is tubal ligation. Postpartum depression screening: negative. I have independently verified the above information. Has some pain and leaking form incision.  The following portions of the patient's history were reviewed and updated as appropriate: allergies, current medications, past family history, past medical history, past social history, past surgical history and problem list.  Review of Systems Pertinent items noted in HPI and remainder of comprehensive ROS otherwise negative.   Objective:    BP (!) 136/102   Pulse (!) 111   Ht 5\' 2"  (1.575 m)   Wt 140 lb (63.5 kg)   LMP 01/12/2018 (Approximate)   Breastfeeding Yes   BMI 25.61 kg/m   General:  alert, cooperative and appears stated age  Lungs: normal effort  Heart:  regular rate and rhythm  Abdomen: soft, non-tender; bowel sounds normal; no masses,  no organomegaly small amount of serous drainage from incision       Treated with AgNO3 Assessment:    Normal postpartum exam. Pap smear not done at today's visit.   Plan:    1. Contraception: tubal ligation 2. Negative depression screen 3. Follow up in: 3 months or as needed.

## 2018-10-27 NOTE — Telephone Encounter (Signed)
Pt's husband called in with concerns about pts incisional area. Infant post c/s on 09/29/2018.   Pt reports area is painful, more painful in the evenings, She is climbing 3 flights of stairs at her home at times. The pain was worse yesterday than it is today.   Pt reports burning pain with cleaning mainly on the left. There is a small amount of blood and yellow drainage with cleaning. Pt reports she is cleaning gently and not rubbing the area. Pt reports the area also has an odor.   Pt with Virtual visit this afternoon. Husband has to work this afternoon and cannot bring pt. Was able to get pt changed to in person visit this morning with Dr. Kennon Rounds. Husband aware they only have 10-15 minutes to get appt but would like to have pt come anyway.

## 2018-11-20 NOTE — Progress Notes (Signed)
Patient seen and assessed by nursing staff during this encounter. I have reviewed the chart and agree with the documentation and plan.  Emeterio Reeve, MD 11/20/2018 2:18 PM

## 2019-06-01 ENCOUNTER — Ambulatory Visit: Payer: Medicaid Other

## 2020-04-30 IMAGING — US US MFM OB DETAIL +14 WK
1 series · 13 of 28 positions shown · non-contrast
Comparison: none

[Series 1: us mfm ob detail +14 wk · 95 acquisitions, 13 frames shown]
[im 4/95]
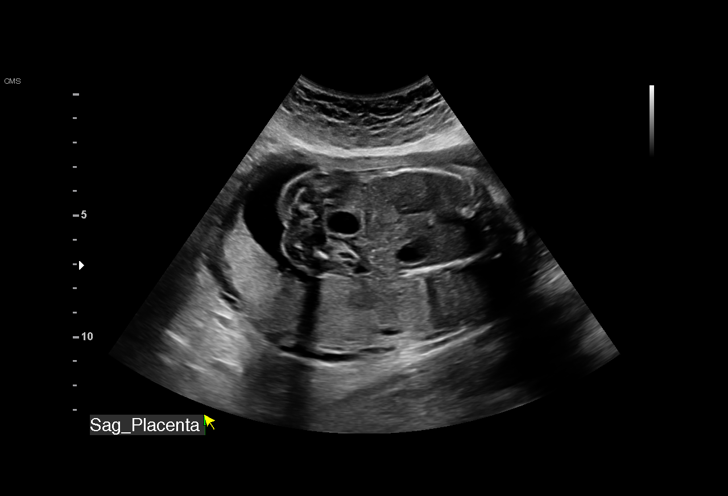
[im 11/95]
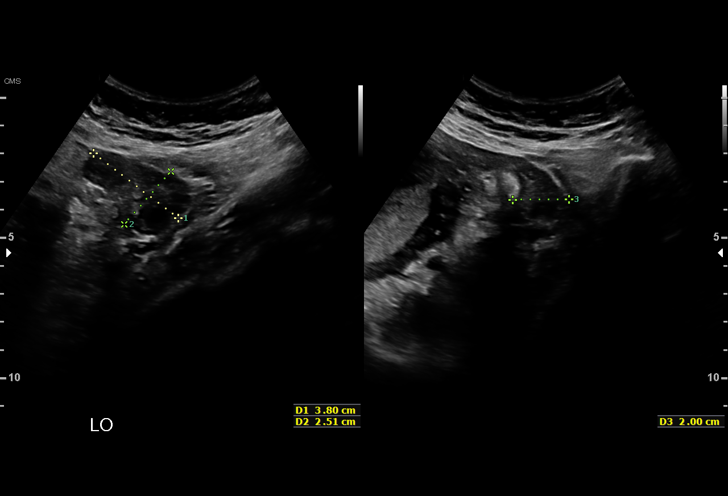
[im 18/95]
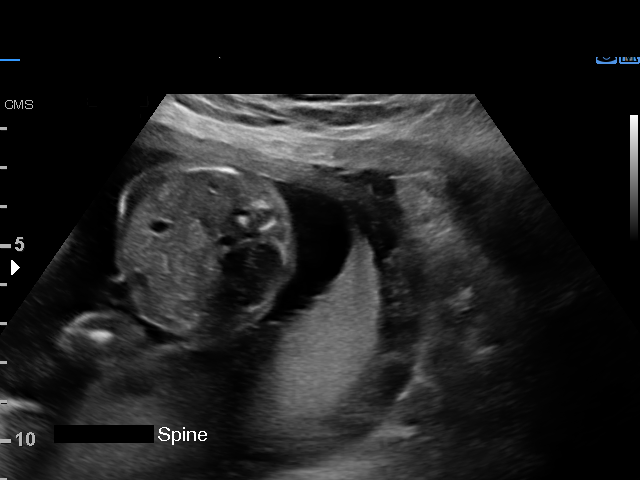
[im 25/95]
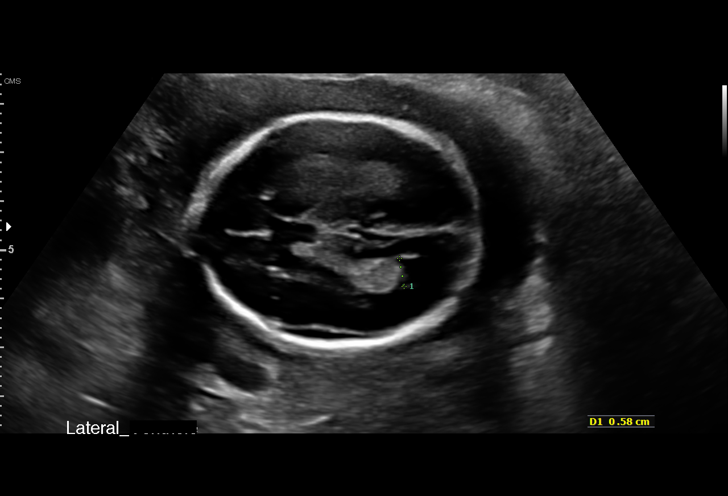
[im 32/95]
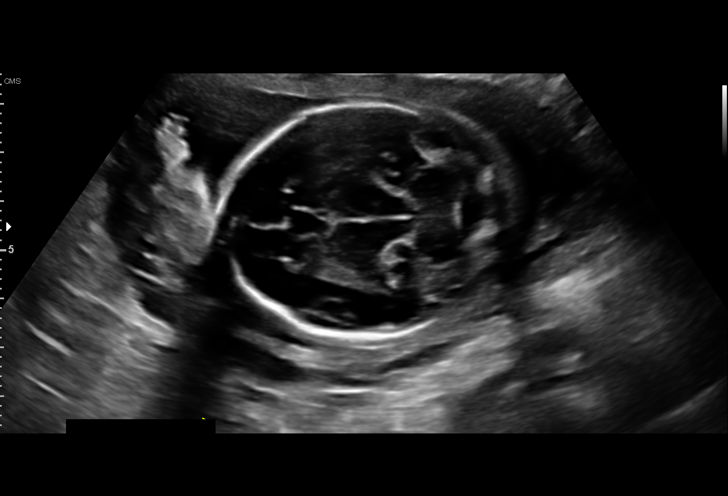
[im 39/95]
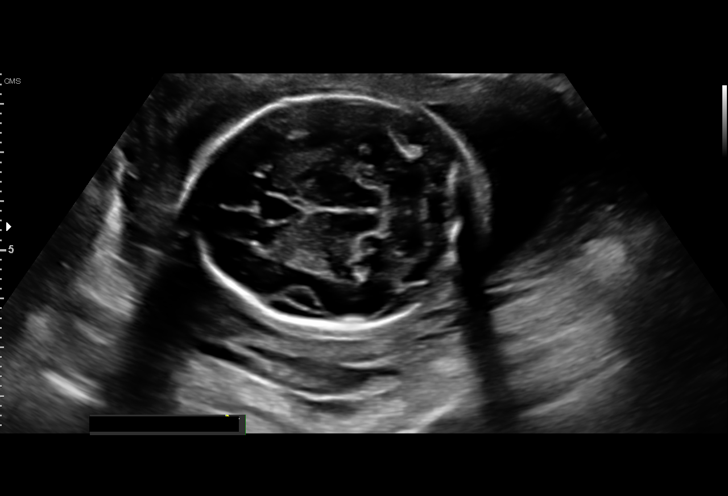
[im 49/95]
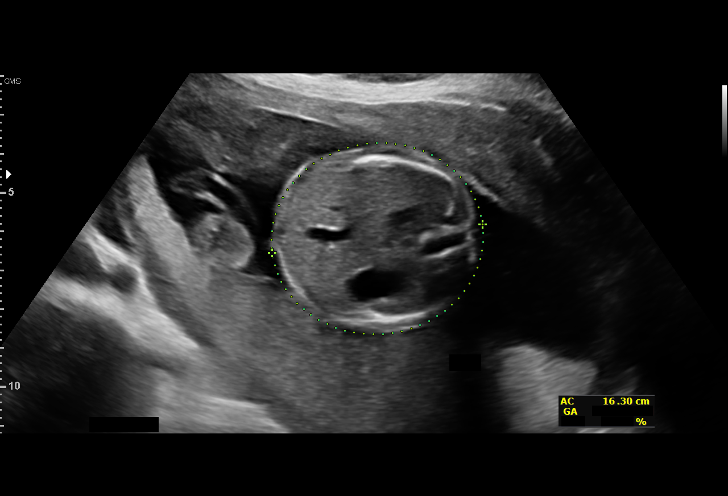
[im 56/95]
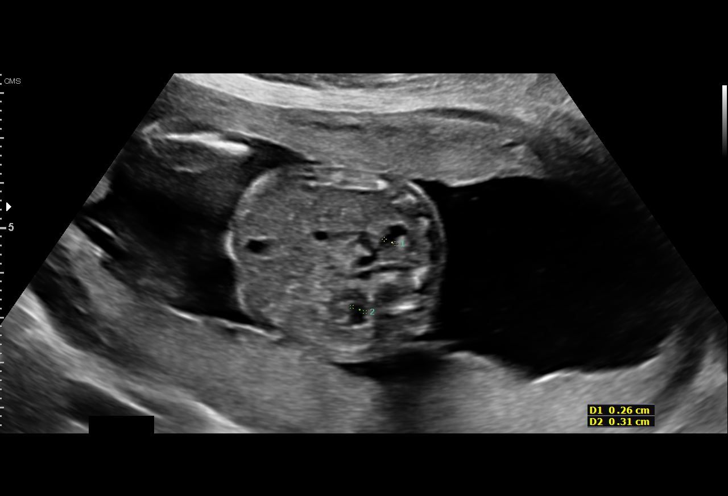
[im 63/95]
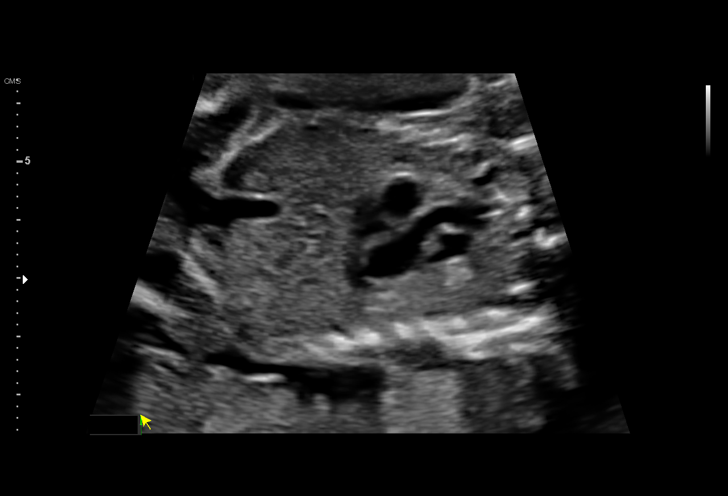
[im 70/95]
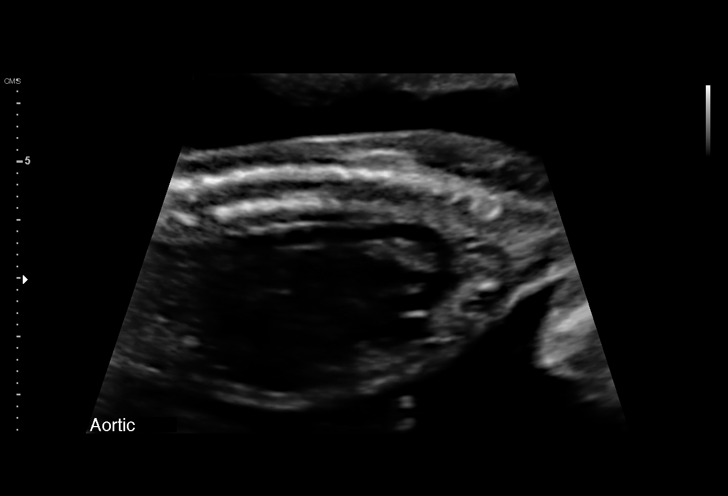
[im 77/95]
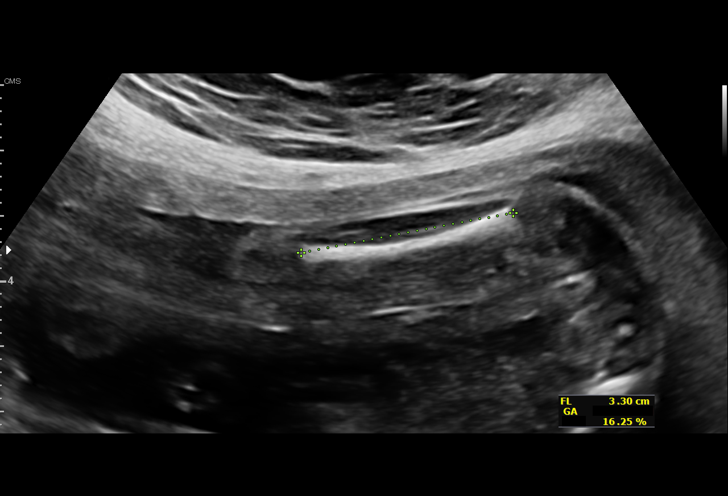
[im 84/95]
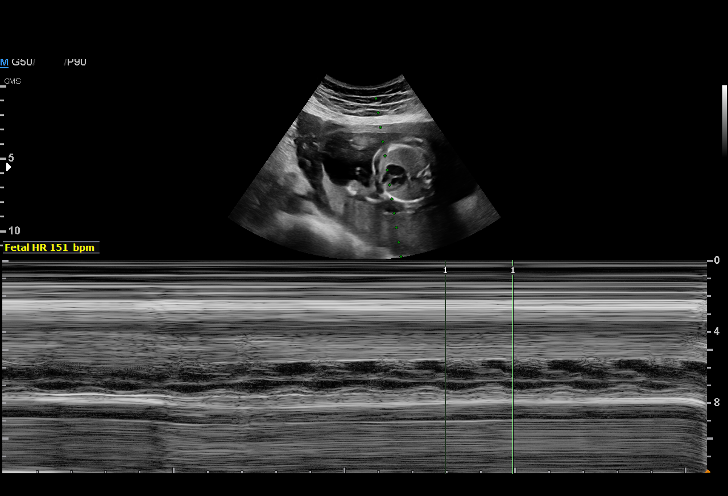
[im 91/95]
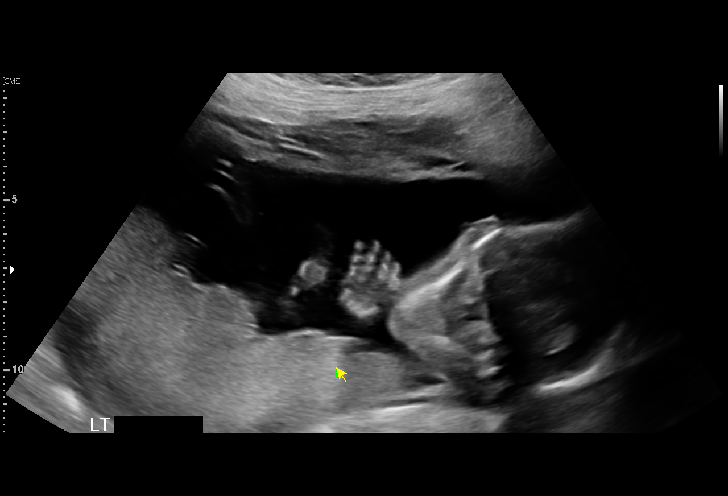

[13 of 28 positions shown; findings below may reference images not displayed]

LAMGADE

                    [REDACTED]-
                    Faculty Physician

 ----------------------------------------------------------------------

 ----------------------------------------------------------------------
Indications

  Encounter for antenatal screening for
  malformations
  Advanced maternal age multigravida 35+,
  second trimester (no testing)
  Poor obstetric history: Previous preeclampsia
  Previous cesarean delivery, antepartum x 2
  21 weeks gestation of pregnancy
 ----------------------------------------------------------------------
Fetal Evaluation

 Num Of Fetuses:          1
 Fetal Heart Rate(bpm):   151
 Cardiac Activity:        Observed
 Presentation:            Cephalic
 Placenta:                Posterior
 P. Cord Insertion:       Visualized

 Amniotic Fluid
 AFI FV:      Within normal limits

                             Largest Pocket(cm)
                             5
Biometry

 BPD:      46.9   mm     G. Age:  20w 1d         14  %    CI:         71.93  %    70 - 86
                                                          FL/HC:       18.5  %    15.9 -
 HC:        176   mm     G. Age:  20w 1d          8  %    HC/AC:       1.08       1.06 -
 AC:        163   mm     G. Age:  21w 3d         51  %    FL/BPD:      69.5  %
 FL:       32.6   mm     G. Age:  20w 1d         14  %    FL/AC:       20.0  %    20 - 24
 HUM:      32.3   mm     G. Age:  20w 6d         39  %
 CER:      21.7   mm     G. Age:  20w 5d         38  %

 CM:         5.7  mm

 Est. FW:     373   gm    0 lb 13 oz      35  %
OB History

 Gravidity:     3         Term:  2          Prem:  0        SAB:   0
 TOP:           0       Ectopic: 0         Living: 2
Gestational Age

 LMP:            19w 5d       Date:  01/12/18                   EDD:  10/19/18
 U/S Today:      20w 3d                                         EDD:  10/14/18
 Best:           21w 1d    Det. By:  Early Ultrasound           EDD:  10/09/18
                                     (03/22/18)
Anatomy

 Cranium:                Appears normal         LVOT:                   Appears normal
 Cavum:                  Appears normal         Aortic Arch:            Appears normal
 Ventricles:             Appears normal         Ductal Arch:            Not well visualized
 Choroid Plexus:         Appears normal         Diaphragm:              Appears normal
 Cerebellum:             Appears normal         Stomach:                Appears normal, left
                                                                        sided
 Posterior Fossa:        Appears normal         Abdomen:                Appears normal
 Nuchal Fold:            Not applicable (>20    Abdominal Wall:         Appears nml (cord
                         wks GA)                                        insert, abd wall)
 Face:                   Appears normal         Cord Vessels:           Appears normal (3
                         (orbits and profile)                           vessel cord)
 Lips:                   Appears normal         Kidneys:                Appear normal
 Palate:                 Not well visualized    Bladder:                Appears normal
 Thoracic:               Appears normal         Spine:                  Appears normal
 Heart:                  Appears normal         Upper Extremities:      Appears normal
                         (4CH, axis, and situs)
 RVOT:                   Appears normal         Lower Extremities:      Appears normal

 Other:   Fetus appears to be a male. Heels and 5th digit visualized. Nasal bone
          visualized.
Cervix Uterus Adnexa

 Cervix
 Length:             3.2  cm.
 Normal appearance by transabdominal scan.

 Uterus
 No abnormality visualized.

 Left Ovary
 Within normal limits.
 Right Ovary
 Within normal limits.

 Cul De Sac
 No free fluid seen.

 Adnexa
 No abnormality visualized.
Impression

 Normal interval growth.  No ultrasonic evidence of structural
 fetal anomalies.
Recommendations

 Follow up growth as clinically indicated.

## 2020-07-08 IMAGING — US US OB TRANSVAGINAL
2 series · 14 of 28 positions shown · non-contrast
Comparison: None.

CLINICAL DATA: Pregnant patient with bleeding.

EXAM:
OBSTETRIC <14 WK US AND TRANSVAGINAL OB US
TECHNIQUE: Both transabdominal and transvaginal ultrasound examinations were
performed for complete evaluation of the gestation as well as the
maternal uterus, adnexal regions, and pelvic cul-de-sac.
Transvaginal technique was performed to assess early pregnancy.

[Series 1: us ob transvaginal · 0.22mm/px · 13 of 48 slices shown (1 of 2)]
[im 2/48]
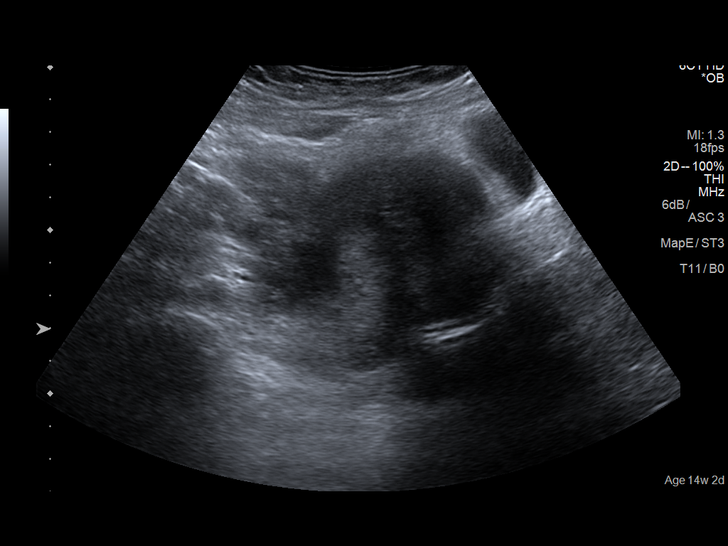
[im 6/48]
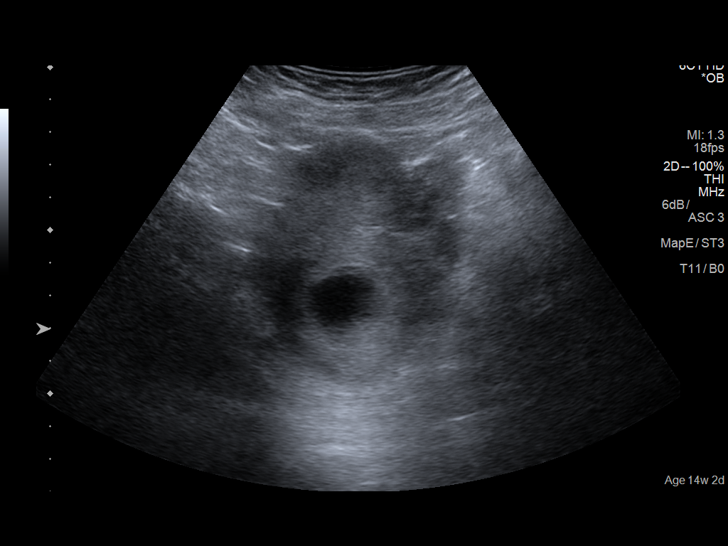
[im 10/48]
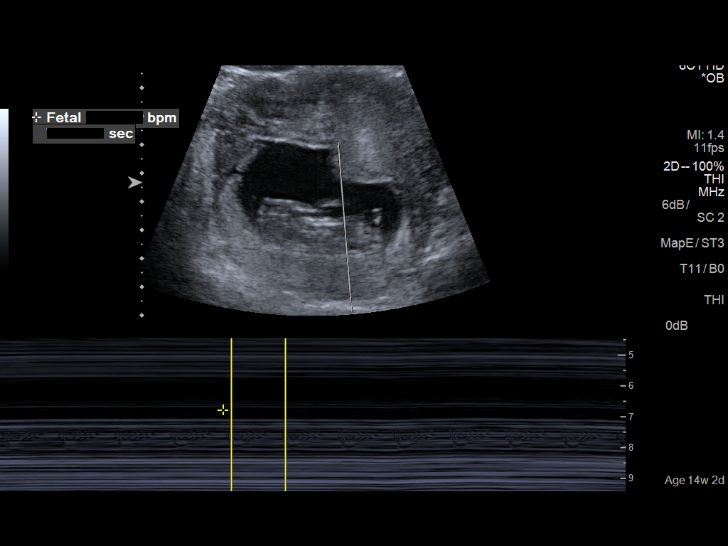
[im 13/48]
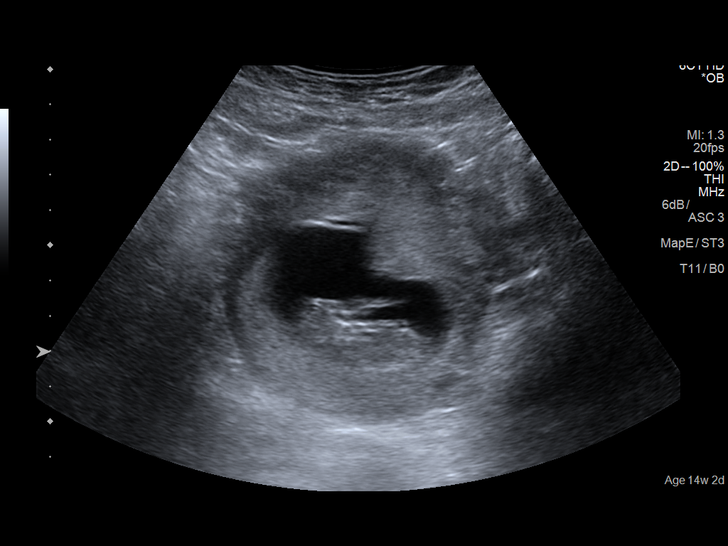
[im 17/48]
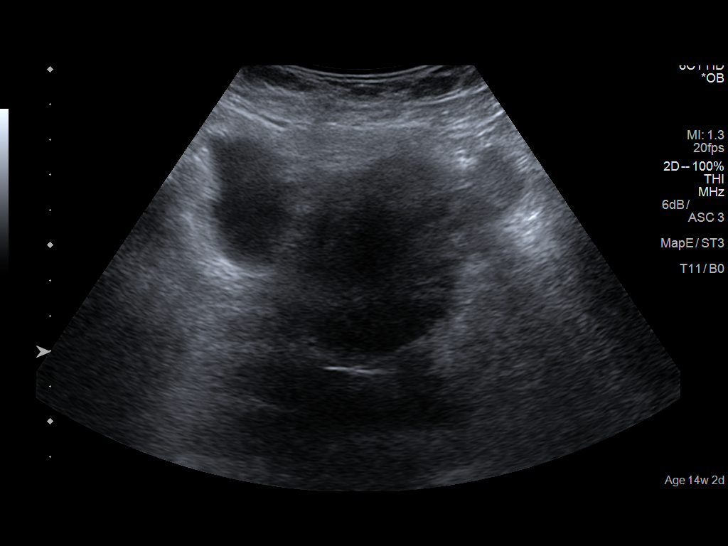
[im 20/48]
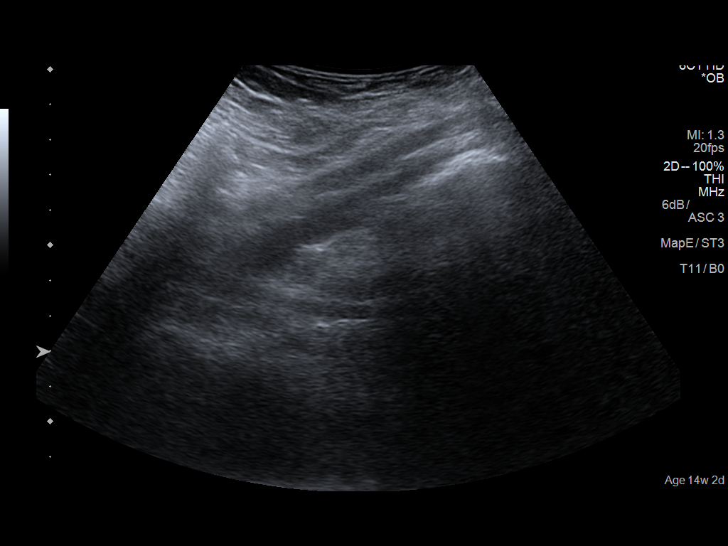
[im 24/48]
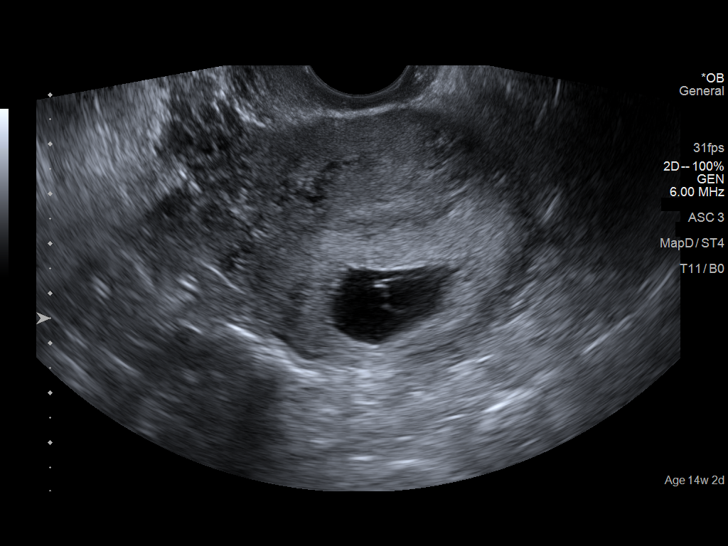
[im 28/48]
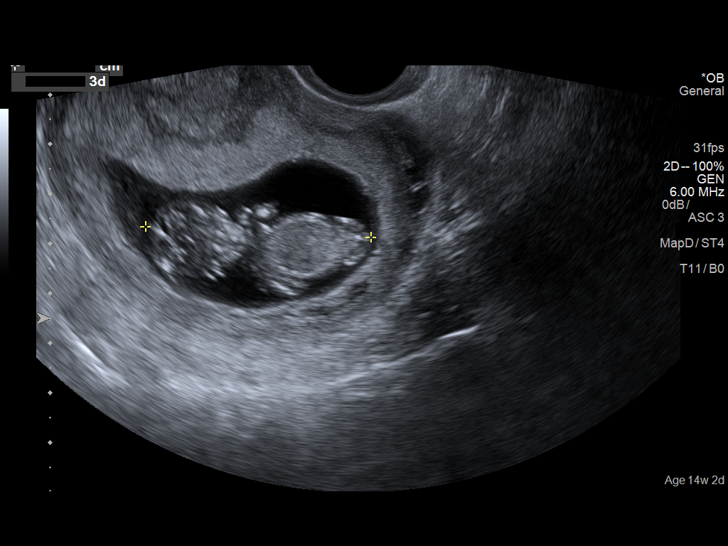
[im 31/48]
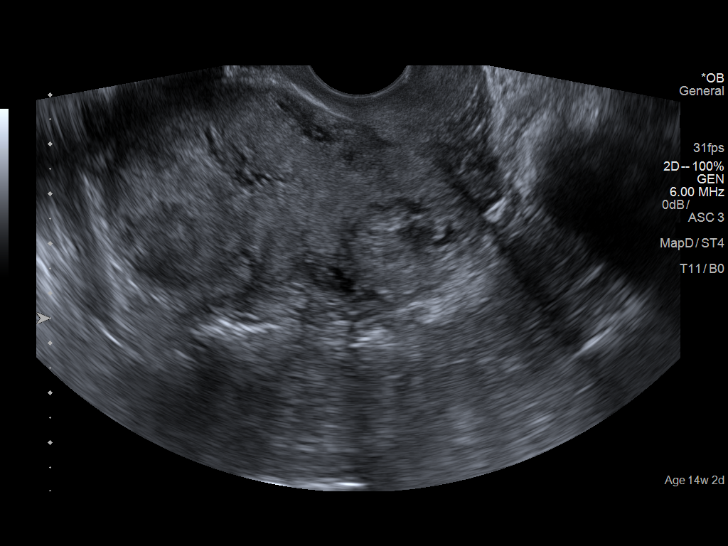
[im 35/48]
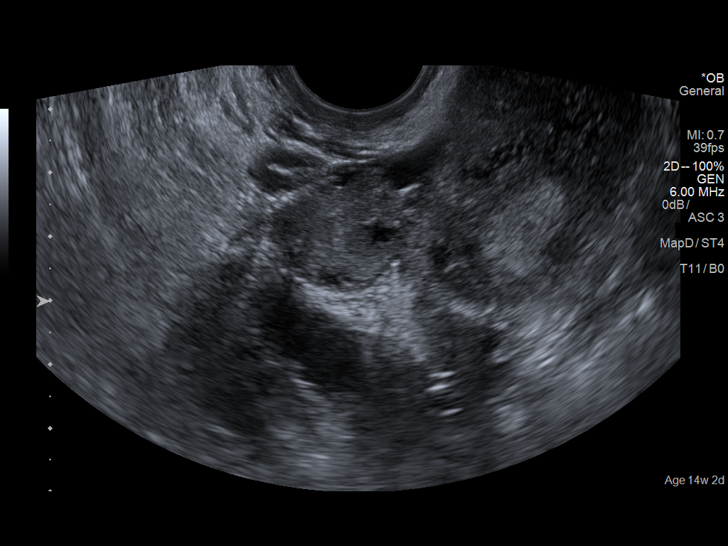
[im 38/48]
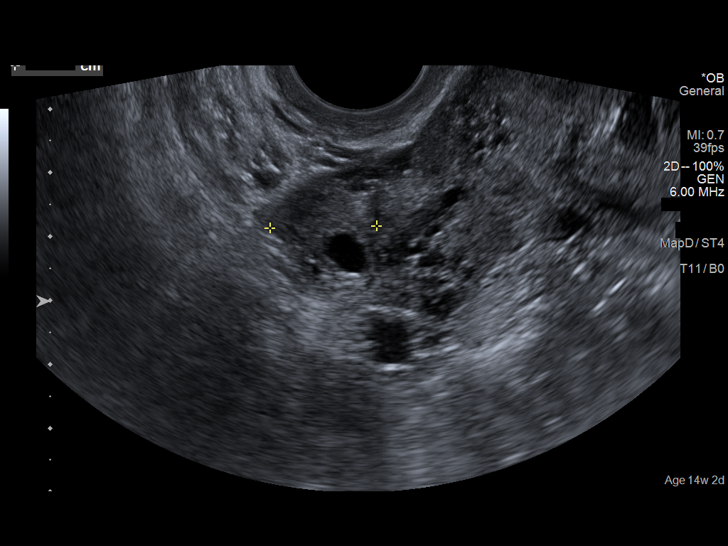
[im 42/48]
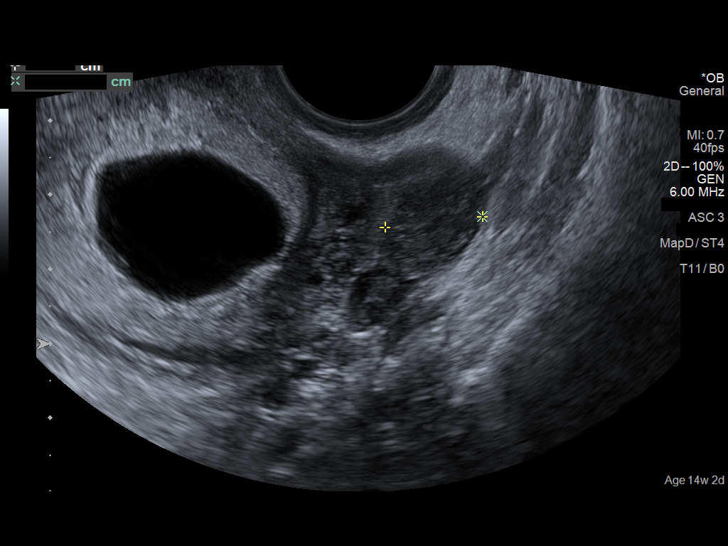
[im 46/48]
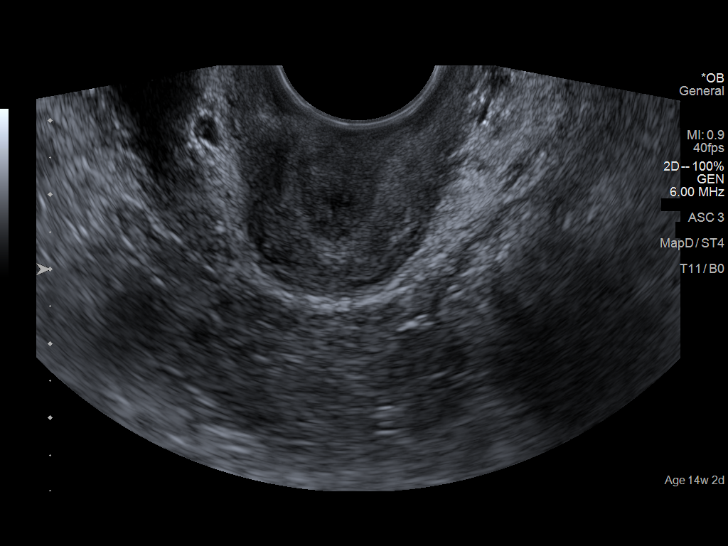

[Series 3: us ob transvaginal · 1 of 1 slices shown (2 of 2)]
[im 1/1]
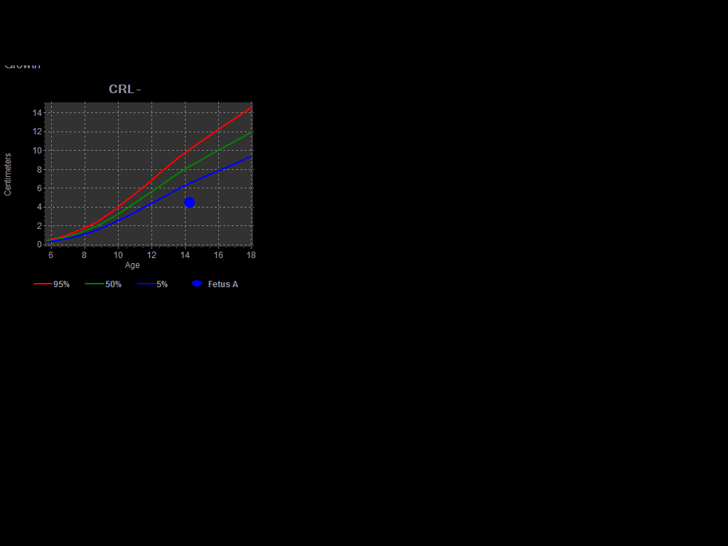

[14 of 28 positions shown; findings below may reference images not displayed]

FINDINGS: Intrauterine gestational sac: Single

Yolk sac:  Not Visualized.

Embryo:  Visualized.

Cardiac Activity: Visualized.

Heart Rate: 185 bpm

MSD:   mm    w     d

CRL:  44.8 mm   11 w   2 d                  US EDC: October 09, 2018

Subchorionic hemorrhage:  None visualized.

Maternal uterus/adnexae: Ovaries are normal in appearance. There is
a small amount of fluid in the cervical canal. No funneling of the
cervix identified.
IMPRESSION: 1. Single live IUP.
2. There is a small amount of fluid in the cervical canal which is
nonspecific. No cervical funneling identified.

## 2021-01-13 ENCOUNTER — Emergency Department (HOSPITAL_COMMUNITY): Payer: Medicaid Other

## 2021-01-13 ENCOUNTER — Other Ambulatory Visit: Payer: Self-pay

## 2021-01-13 ENCOUNTER — Emergency Department (HOSPITAL_COMMUNITY)
Admission: EM | Admit: 2021-01-13 | Discharge: 2021-01-13 | Disposition: A | Discharge status: Home / Self Care | Payer: Medicaid Other | Attending: Emergency Medicine | Admitting: Emergency Medicine

## 2021-01-13 DIAGNOSIS — M549 Dorsalgia, unspecified: Secondary | ICD-10-CM | POA: Diagnosis not present

## 2021-01-13 DIAGNOSIS — R1011 Right upper quadrant pain: Secondary | ICD-10-CM | POA: Diagnosis not present

## 2021-01-13 DIAGNOSIS — R109 Unspecified abdominal pain: Secondary | ICD-10-CM | POA: Diagnosis not present

## 2021-01-13 DIAGNOSIS — R1013 Epigastric pain: Secondary | ICD-10-CM | POA: Diagnosis not present

## 2021-01-13 DIAGNOSIS — R1033 Periumbilical pain: Secondary | ICD-10-CM | POA: Insufficient documentation

## 2021-01-13 DIAGNOSIS — T68XXXA Hypothermia, initial encounter: Secondary | ICD-10-CM | POA: Diagnosis not present

## 2021-01-13 DIAGNOSIS — R1084 Generalized abdominal pain: Secondary | ICD-10-CM | POA: Diagnosis not present

## 2021-01-13 LAB — CBC WITH DIFFERENTIAL/PLATELET
Abs Immature Granulocytes: 0.07 10*3/uL (ref 0.00–0.07)
Basophils Absolute: 0.1 10*3/uL (ref 0.0–0.1)
Basophils Relative: 0 %
Eosinophils Absolute: 0 10*3/uL (ref 0.0–0.5)
Eosinophils Relative: 0 %
HCT: 33.8 % — ABNORMAL LOW (ref 36.0–46.0)
Hemoglobin: 10.8 g/dL — ABNORMAL LOW (ref 12.0–15.0)
Immature Granulocytes: 0 %
Lymphocytes Relative: 9 %
Lymphs Abs: 1.4 10*3/uL (ref 0.7–4.0)
MCH: 26.2 pg (ref 26.0–34.0)
MCHC: 32 g/dL (ref 30.0–36.0)
MCV: 81.8 fL (ref 80.0–100.0)
Monocytes Absolute: 0.5 10*3/uL (ref 0.1–1.0)
Monocytes Relative: 3 %
Neutro Abs: 14 10*3/uL — ABNORMAL HIGH (ref 1.7–7.7)
Neutrophils Relative %: 88 %
Platelets: 98 10*3/uL — ABNORMAL LOW (ref 150–400)
RBC: 4.13 MIL/uL (ref 3.87–5.11)
RDW: 15.8 % — ABNORMAL HIGH (ref 11.5–15.5)
Smear Review: DECREASED
WBC: 16.1 10*3/uL — ABNORMAL HIGH (ref 4.0–10.5)
nRBC: 0 % (ref 0.0–0.2)

## 2021-01-13 LAB — COMPREHENSIVE METABOLIC PANEL
ALT: 20 U/L (ref 0–44)
AST: 28 U/L (ref 15–41)
Albumin: 4.2 g/dL (ref 3.5–5.0)
Alkaline Phosphatase: 80 U/L (ref 38–126)
Anion gap: 9 (ref 5–15)
BUN: 9 mg/dL (ref 6–20)
CO2: 25 mmol/L (ref 22–32)
Calcium: 9.9 mg/dL (ref 8.9–10.3)
Chloride: 102 mmol/L (ref 98–111)
Creatinine, Ser: 0.58 mg/dL (ref 0.44–1.00)
GFR, Estimated: >60 mL/min (ref >60)
Glucose, Bld: 122 mg/dL — ABNORMAL HIGH (ref 70–99)
Potassium: 4 mmol/L (ref 3.5–5.1)
Sodium: 136 mmol/L (ref 135–145)
Total Bilirubin: 1.1 mg/dL (ref 0.3–1.2)
Total Protein: 7.6 g/dL (ref 6.5–8.1)

## 2021-01-13 LAB — URINALYSIS, ROUTINE W REFLEX MICROSCOPIC
Bacteria, UA: NONE SEEN
Bilirubin Urine: NEGATIVE
Glucose, UA: NEGATIVE mg/dL
Ketones, ur: NEGATIVE mg/dL
Leukocytes,Ua: NEGATIVE
Nitrite: NEGATIVE
Protein, ur: NEGATIVE mg/dL
Specific Gravity, Urine: 1.016 (ref 1.005–1.030)
pH: 7 (ref 5.0–8.0)

## 2021-01-13 LAB — LACTIC ACID, PLASMA: Lactic Acid, Venous: 2 mmol/L (ref 0.5–1.9)

## 2021-01-13 LAB — I-STAT BETA HCG BLOOD, ED (MC, WL, AP ONLY): I-stat hCG, quantitative: <5 m[IU]/mL (ref <5)

## 2021-01-13 LAB — LIPASE, BLOOD: Lipase: 43 U/L (ref 11–51)

## 2021-01-13 MED ORDER — FAMOTIDINE 20 MG PO TABS: 20.0000 mg | ORAL_TABLET | Freq: Two times a day (BID) | ORAL | 0 refills | Status: AC

## 2021-01-13 MED ORDER — SODIUM CHLORIDE 0.9 % IV BOLUS
1000.0000 mL | Freq: Once | INTRAVENOUS | Status: AC
  Administered 2021-01-13: 1000 mL via INTRAVENOUS

## 2021-01-13 MED ORDER — IOHEXOL 350 MG/ML SOLN
100.0000 mL | Freq: Once | INTRAVENOUS | Status: AC | PRN
  Administered 2021-01-13: 100 mL via INTRAVENOUS

## 2021-01-13 MED ORDER — SODIUM CHLORIDE 0.9 % IV SOLN: INTRAVENOUS | Status: DC

## 2021-01-13 MED ORDER — TRAMADOL HCL 50 MG PO TABS: ORAL_TABLET | ORAL | 0 refills | Status: AC

## 2021-01-13 MED ORDER — HYDROMORPHONE HCL 1 MG/ML IJ SOLN: 1.0000 mg | INTRAMUSCULAR | Status: DC | PRN

## 2021-01-13 MED ORDER — HYDROMORPHONE HCL 1 MG/ML IJ SOLN
1.0000 mg | Freq: Once | INTRAMUSCULAR | Status: AC
  Administered 2021-01-13: 1 mg via INTRAVENOUS
  Filled 2021-01-13: qty 1

## 2021-01-13 MED ORDER — ONDANSETRON HCL 4 MG/2ML IJ SOLN
4.0000 mg | Freq: Once | INTRAMUSCULAR | Status: AC
  Administered 2021-01-13: 4 mg via INTRAVENOUS
  Filled 2021-01-13: qty 2

## 2021-01-13 MED ORDER — ONDANSETRON 4 MG PO TBDP: 4.0000 mg | ORAL_TABLET | ORAL | 0 refills | Status: AC | PRN

## 2021-01-13 NOTE — ED Triage Notes (Signed)
Acute onset of abdominal pain, radiating through the back, started last night. VS: 130/80 HR 102 100% sp02.

## 2021-01-13 NOTE — ED Notes (Signed)
Go patient into a gown on the monitor got patient a warm blanket patient is resting with call bell in reach

## 2021-01-13 NOTE — Discharge Instructions (Signed)
1.  Take Pepcid twice a day for the next 2 weeks as prescribed.  If you have nausea or vomiting, you may take Zofran every 4 hours as prescribed.  If you have abdominal pain you may take 1-2 tramadol tablets as prescribed. 2.  Your symptoms should improve quickly.  If you are having persisting or worsening symptoms, you must be rechecked at the emergency department as soon as possible. 3.  You need a recheck with a primary care doctor.  You should have a physician to follow-up with.  Use the referral number in your discharge instructions to help you find a doctor.

## 2021-01-13 NOTE — ED Provider Notes (Signed)
[facility] EMERGENCY DEPARTMENT Provider Note   CSN: [id number] Arrival date & time: 01/13/21  3785     History Chief Complaint  Patient presents with   Abdominal Pain    Kristine Silva is a 40 y.o. female.  HPI Gradual onset of central and upper abdominal pain starting at about 9 PM yesterday evening.  Pain quickly got worse overnight.  Severe central cramping and sharp pain in the abdomen.  Several episodes of vomiting.  No diarrhea.  Patient denies recent constipation.  No pain burning urgency with urination.  Patient denies history of similar symptoms.  Kristine Silva ate dinner with Kristine Silva spouse yesterday evening.  Kristine Silva reports Kristine Silva had mild discomfort at that time but not severe.  No recent fever chills or body aches.  Patient reports Kristine Silva felt well before onset of symptoms.  Prior abdominal surgeries include C-sections.  No other history of abdominal surgeries.    Past Medical History:  Diagnosis Date   Medical history non-contributory    Pregnancy induced hypertension    Tuberculosis    Pt treated with rifampin for TB upon arrival. Pt stopped after a couple of days d/t reaction. second son treated for 1 year.    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  Social History   Tobacco Use   Smoking status: Never   Smokeless tobacco: Never  Vaping Use   Vaping Use: Never used  Substance Use Topics   Alcohol use: Not Currently   Drug  use: Never    Home Medications Prior to Admission medications   Medication Sig Start Date End Date Taking? Authorizing Provider  famotidine (PEPCID) 20 MG tablet Take 1 tablet (20 mg total) by mouth 2 (two) times daily. 01/13/21  Yes Arby Barrette, MD  ondansetron (ZOFRAN ODT) 4 MG disintegrating tablet Take 1 tablet (4 mg total) by mouth every 4 (four) hours as needed for nausea or vomiting. 01/13/21  Yes Arby Barrette, MD  traMADol (ULTRAM) 50 MG tablet 1-2 tablets every 6 hours as needed for pain 01/13/21  Yes Bonnita Newby, Lebron Conners, MD  FEROSUL 325 (65 Fe) MG tablet Take 325 mg by mouth 2 (two) times a day. 09/11/18   [provider]  ibuprofen (ADVIL) 800 MG tablet Take 1 tablet (800 mg total) by mouth every 6 (six) hours. Patient not taking: Reported on 10/27/2018 10/01/18   Arvilla Market, MD  Prenatal 27-1 MG TABS Take 1 tablet by mouth daily. 09/11/18   [provider]    Allergies    Patient has no known allergies.  Review of Systems   Review of Systems 10 systems negative except as per  HPI Physical Exam Updated Vital Signs BP 117/84   Pulse 88   Temp 98.1 F (36.7 C) (Oral)   Resp 14   SpO2 96%   Physical Exam Constitutional:      Comments: Alert nontoxic.  Clear mental status.  Appears very uncomfortable.  Well-nourished well-developed.  HENT:     Mouth/Throat:     Pharynx: Oropharynx is clear.  Eyes:     Extraocular Movements: Extraocular movements intact.  Cardiovascular:     Rate and Rhythm: Normal rate and regular rhythm.  Pulmonary:     Effort: Pulmonary effort is normal.     Breath sounds: Normal breath sounds.  Abdominal:     Comments: Moderate to severe pain to palpation in the periumbilical and epigastric region.  No guarding.  Lower abdomen nontender.  Musculoskeletal:        General: No swelling or tenderness.     Right lower leg: No edema.     Left lower leg: No edema.  Skin:    General: Skin is warm and dry.   Neurological:     General: No focal deficit present.     Mental Status: Kristine Silva is oriented to person, place, and time.     Coordination: Coordination normal.    ED Results / Procedures / Treatments   Labs (all labs ordered are listed, but only abnormal results are displayed) Labs Reviewed  COMPREHENSIVE METABOLIC PANEL - Abnormal; Notable for the following components:      Result Value   Glucose, Bld 122 (*)    All other components within normal limits  CBC WITH DIFFERENTIAL/PLATELET - Abnormal; Notable for the following components:   WBC 16.1 (*)    Hemoglobin 10.8 (*)    HCT 33.8 (*)    RDW 15.8 (*)    Platelets 98 (*)    Neutro Abs 14.0 (*)    All other components within normal limits  URINALYSIS, ROUTINE W REFLEX MICROSCOPIC - Abnormal; Notable for the following components:   Hgb urine dipstick SMALL (*)    All other components within normal limits  LACTIC ACID, PLASMA - Abnormal; Notable for the following components:   Lactic Acid, Venous 2.0 (*)    All other components within normal limits  LIPASE, BLOOD  I-STAT BETA HCG BLOOD, ED ([facility], [facility], [facility] ONLY)    EKG None  Radiology CT ABDOMEN PELVIS W CONTRAST  Result Date: 01/13/2021 CLINICAL DATA:  Abdominal pain. EXAM: CT ABDOMEN AND PELVIS WITH CONTRAST TECHNIQUE: Multidetector CT imaging of the abdomen and pelvis was performed using the standard protocol following bolus administration of intravenous contrast. CONTRAST:  OMNIPAQUE IOHEXOL 350 MG/ML SOLN COMPARISON:  None. FINDINGS: Lower chest: No acute abnormality. Mild subsegmental atelectasis in both lower lobes. Hepatobiliary: No focal liver abnormality. Borderline gallbladder wall thickening. No gallstones or biliary dilatation. Pancreas: Unremarkable. No pancreatic ductal dilatation or surrounding inflammatory changes. Spleen: Mildly enlarged.  No focal abnormality. Adrenals/Urinary Tract: Adrenal glands are unremarkable. Kidneys are normal, without renal calculi,  focal lesion, or hydronephrosis. Bladder is unremarkable. Stomach/Bowel: Stomach is within normal limits. Appendix appears normal. No evidence of bowel wall thickening, distention, or inflammatory changes. Vascular/Lymphatic: No significant vascular findings are present. No enlarged abdominal or pelvic lymph nodes. Reproductive: Uterus and bilateral adnexa are unremarkable. Other: Tiny fat containing umbilical hernia. No free fluid or pneumoperitoneum. Musculoskeletal: No acute or significant osseous findings. IMPRESSION: 1. Borderline gallbladder wall thickening. No gallstones or biliary dilatation. If there is clinical concern for acute cholecystitis, recommend further  evaluation with right upper quadrant ultrasound. 2. Mild splenomegaly. Electronically Signed   By: Obie Dredge M.D.   On: 01/13/2021 13:43   US Abdomen Limited  Result Date: 01/13/2021 CLINICAL DATA:  Right upper quadrant pain. EXAM: ULTRASOUND ABDOMEN LIMITED RIGHT UPPER QUADRANT COMPARISON:  CT abdomen and pelvis 01/13/2021. FINDINGS: Gallbladder: No gallstones or wall thickening visualized. No sonographic Murphy sign noted by sonographer. Common bile duct: Diameter: 4.7 mm. Liver: No focal lesion identified. Within normal limits in parenchymal echogenicity. Portal vein is patent on color Doppler imaging with normal direction of blood flow towards the liver. Other: None. IMPRESSION: Within normal limits. Electronically Signed   By: Darliss Cheney M.D.   On: 01/13/2021 15:21    Procedures Procedures   Medications Ordered in ED Medications  sodium chloride 0.9 % bolus 1,000 mL (0 mLs Intravenous Stopped 01/13/21 1211)    And  0.9 %  sodium chloride infusion ( Intravenous New Bag/Given 01/13/21 1007)  HYDROmorphone (DILAUDID) injection 1 mg (has no administration in time range)  HYDROmorphone (DILAUDID) injection 1 mg (1 mg Intravenous Given 01/13/21 1002)  ondansetron (ZOFRAN) injection 4 mg (4 mg Intravenous Given 01/13/21 1000)   iohexol (OMNIPAQUE) 350 MG/ML injection 100 mL (100 mLs Intravenous Contrast Given 01/13/21 1328)    ED Course  I have reviewed the triage vital signs and the nursing notes.  Pertinent labs & imaging results that were available during my care of the patient were reviewed by me and considered in my medical decision making (see chart for details).    MDM Rules/Calculators/A&P                           Recheck 15: 45 patient reports feeling much better.  Kristine Silva is not showing signs of pain vital signs normal.  Patient presents with severe epigastric and central abdominal pain.  It escalated overnight.  Patient was very uncomfortable and tender to palpation on arrival.  Diagnostic test showed 16,000 white count.  CT scan does not show acute findings.  There was question of gallbladder wall thickening.  Ultrasound gallbladder is normal in appearance without wall thickening.  Repeat exam shows patient nontender on exam.  At this time plan will be for treatment with Pepcid twice daily and Zofran as needed.  Short course of tramadol if needed for more severe pain.  Careful return precautions reviewed.  Patient and Kristine Silva husband are advised to have follow-up with primary care physician for recheck. Final Clinical Impression(s) / ED Diagnoses Final diagnoses:  Epigastric pain    Rx / DC Orders ED Discharge Orders          Ordered    famotidine (PEPCID) 20 MG tablet  2 times daily        01/13/21 1554    ondansetron (ZOFRAN ODT) 4 MG disintegrating tablet  Every 4 hours PRN        01/13/21 1554    traMADol (ULTRAM) 50 MG tablet        01/13/21 1554             Arby Barrette, MD 01/13/21 1558

## 2021-01-16 ENCOUNTER — Telehealth: Payer: Self-pay

## 2021-01-16 NOTE — Telephone Encounter (Signed)
Transition Care Management Unsuccessful Follow-up Telephone Call  Date of discharge and from where:  01/13/2021-[place]   Attempts:  1st Attempt  Reason for unsuccessful TCM follow-up call:  Unable to leave message

## 2021-01-17 NOTE — Telephone Encounter (Signed)
Transition Care Management Unsuccessful Follow-up Telephone Call  Date of discharge and from where:  01/13/2021 from [facility]  Attempts:  2nd Attempt  Reason for unsuccessful TCM follow-up call:  Unable to leave message

## 2021-01-18 NOTE — Telephone Encounter (Signed)
Transition Care Management Unsuccessful Follow-up Telephone Call  Date of discharge and from where:  01/13/2021 from [facility]  Attempts:  3rd Attempt  Reason for unsuccessful TCM follow-up call:  Unable to reach patient

## 2021-03-10 ENCOUNTER — Emergency Department (HOSPITAL_COMMUNITY): Payer: Medicaid Other

## 2021-03-10 ENCOUNTER — Emergency Department (HOSPITAL_COMMUNITY)
Admission: EM | Admit: 2021-03-10 | Discharge: 2021-03-10 | Disposition: A | Discharge status: Home / Self Care | Payer: Medicaid Other | Attending: Emergency Medicine | Admitting: Emergency Medicine

## 2021-03-10 DIAGNOSIS — R112 Nausea with vomiting, unspecified: Secondary | ICD-10-CM | POA: Diagnosis not present

## 2021-03-10 DIAGNOSIS — R1013 Epigastric pain: Secondary | ICD-10-CM | POA: Insufficient documentation

## 2021-03-10 DIAGNOSIS — R1084 Generalized abdominal pain: Secondary | ICD-10-CM | POA: Diagnosis not present

## 2021-03-10 DIAGNOSIS — R110 Nausea: Secondary | ICD-10-CM | POA: Diagnosis not present

## 2021-03-10 LAB — COMPREHENSIVE METABOLIC PANEL
ALT: 20 U/L (ref 0–44)
AST: 29 U/L (ref 15–41)
Albumin: 4.3 g/dL (ref 3.5–5.0)
Alkaline Phosphatase: 81 U/L (ref 38–126)
Anion gap: 10 (ref 5–15)
BUN: 11 mg/dL (ref 6–20)
CO2: 22 mmol/L (ref 22–32)
Calcium: 9.6 mg/dL (ref 8.9–10.3)
Chloride: 101 mmol/L (ref 98–111)
Creatinine, Ser: 0.69 mg/dL (ref 0.44–1.00)
GFR, Estimated: >60 mL/min (ref >60)
Glucose, Bld: 146 mg/dL — ABNORMAL HIGH (ref 70–99)
Potassium: 3.9 mmol/L (ref 3.5–5.1)
Sodium: 133 mmol/L — ABNORMAL LOW (ref 135–145)
Total Bilirubin: 0.8 mg/dL (ref 0.3–1.2)
Total Protein: 7.9 g/dL (ref 6.5–8.1)

## 2021-03-10 LAB — CBC WITH DIFFERENTIAL/PLATELET
Abs Immature Granulocytes: 0.06 10*3/uL (ref 0.00–0.07)
Basophils Absolute: 0.1 10*3/uL (ref 0.0–0.1)
Basophils Relative: 1 %
Eosinophils Absolute: 0.1 10*3/uL (ref 0.0–0.5)
Eosinophils Relative: 0 %
HCT: 34.9 % — ABNORMAL LOW (ref 36.0–46.0)
Hemoglobin: 11.1 g/dL — ABNORMAL LOW (ref 12.0–15.0)
Immature Granulocytes: 1 %
Lymphocytes Relative: 17 %
Lymphs Abs: 2 10*3/uL (ref 0.7–4.0)
MCH: 25.9 pg — ABNORMAL LOW (ref 26.0–34.0)
MCHC: 31.8 g/dL (ref 30.0–36.0)
MCV: 81.4 fL (ref 80.0–100.0)
Monocytes Absolute: 0.4 10*3/uL (ref 0.1–1.0)
Monocytes Relative: 4 %
Neutro Abs: 9 10*3/uL — ABNORMAL HIGH (ref 1.7–7.7)
Neutrophils Relative %: 77 %
Platelets: 115 10*3/uL — ABNORMAL LOW (ref 150–400)
RBC: 4.29 MIL/uL (ref 3.87–5.11)
RDW: 15.1 % (ref 11.5–15.5)
WBC: 11.6 10*3/uL — ABNORMAL HIGH (ref 4.0–10.5)
nRBC: 0 % (ref 0.0–0.2)

## 2021-03-10 LAB — I-STAT BETA HCG BLOOD, ED (MC, WL, AP ONLY): I-stat hCG, quantitative: <5 m[IU]/mL (ref <5)

## 2021-03-10 LAB — LIPASE, BLOOD: Lipase: 43 U/L (ref 11–51)

## 2021-03-10 MED ORDER — OXYCODONE-ACETAMINOPHEN 5-325 MG PO TABS: 1.0000 | ORAL_TABLET | Freq: Once | ORAL | Status: DC

## 2021-03-10 MED ORDER — PANTOPRAZOLE SODIUM 20 MG PO TBEC: 20.0000 mg | DELAYED_RELEASE_TABLET | Freq: Every day | ORAL | 0 refills | Status: DC

## 2021-03-10 MED ORDER — ONDANSETRON HCL 8 MG PO TABS
8.0000 mg | ORAL_TABLET | Freq: Three times a day (TID) | ORAL | 0 refills | Status: AC | PRN
Start: 2021-03-10 — End: ?

## 2021-03-10 MED ORDER — IOHEXOL 300 MG/ML  SOLN
100.0000 mL | Freq: Once | INTRAMUSCULAR | Status: AC | PRN
  Administered 2021-03-10: 100 mL via INTRAVENOUS

## 2021-03-10 MED ORDER — MORPHINE SULFATE (PF) 2 MG/ML IV SOLN
4.0000 mg | Freq: Once | INTRAVENOUS | Status: AC
  Administered 2021-03-10: 4 mg via INTRAVENOUS
  Filled 2021-03-10: qty 2

## 2021-03-10 NOTE — Discharge Instructions (Addendum)
Some small areas consistent with cysts are noted in your kidney No other abnormality was seen on your CAT scan to define why you are having abdominal pain. Please take the medication as prescribed Please follow-up with gastroenterology and obtain primary care for further evaluation of cyst noted on kidneys.

## 2021-03-10 NOTE — ED Provider Notes (Signed)
[facility] EMERGENCY DEPARTMENT Provider Note   CSN: [id number] Arrival date & time: 03/10/21  0737     History  Chief Complaint  Patient presents with   Abdominal Pain    Silva Silva is a 41 y.o. female.  HPI History obtained through interpreter 41 yo female presents complaining of epigastric pain began last night at 11 p.  Pain awoke her at 11 pm.  Associated nausea and vomited x 1 , nbnb.   Similar symptoms 2 months ago, resolved on own.Pain  "bad"  Lmp 03/02/21.  No s/p btl, g3p3  Home Medications Prior to Admission medications   Medication Sig Start Date End Date Taking? Authorizing Provider  ondansetron (ZOFRAN) 8 MG tablet Take 1 tablet (8 mg total) by mouth every 8 (eight) hours as needed for nausea or vomiting. 03/10/21  Yes Margarita Grizzle, MD  pantoprazole (PROTONIX) 20 MG tablet Take 1 tablet (20 mg total) by mouth daily. 03/10/21  Yes Margarita Grizzle, MD  famotidine (PEPCID) 20 MG tablet Take 1 tablet (20 mg total) by mouth 2 (two) times daily. 01/13/21   Arby Barrette, MD  FEROSUL 325 (65 Fe) MG tablet Take 325 mg by mouth 2 (two) times a day. 09/11/18   [provider]  ibuprofen (ADVIL) 800 MG tablet Take 1 tablet (800 mg total) by mouth every 6 (six) hours. Patient not taking: Reported on 10/27/2018 10/01/18   Arvilla Market, MD  ondansetron (ZOFRAN ODT) 4 MG disintegrating tablet Take 1 tablet (4 mg total) by mouth every 4 (four) hours as needed for nausea or vomiting. 01/13/21   Arby Barrette, MD  Prenatal 27-1 MG TABS Take 1 tablet by mouth daily. 09/11/18   [provider]  traMADol Janean Sark) 50 MG tablet 1-2 tablets every 6 hours as needed for pain 01/13/21   Arby Barrette, MD      Allergies    Patient has no known allergies.    Review of Systems   Review of Systems  Constitutional: Negative.   HENT: Negative.    Eyes: Negative.   Respiratory: Negative.    Cardiovascular: Negative.    Gastrointestinal:  Positive for abdominal pain.  Endocrine: Negative.   Genitourinary: Negative.   Musculoskeletal: Negative.   Skin: Negative.   Neurological: Negative.   Hematological: Negative.   Psychiatric/Behavioral: Negative.    All other systems reviewed and are negative.  Physical Exam Updated Vital Signs BP 124/84 (BP Location: Left Arm)    Pulse 71    Temp 97.6 F (36.4 C) (Oral)    Resp 17    SpO2 100%  Physical Exam Vitals and nursing note reviewed.  Constitutional:      General: She is not in acute distress.    Appearance: She is well-developed. She is obese. She is not ill-appearing.  HENT:     Head: Normocephalic.     Mouth/Throat:     Mouth: Mucous membranes are moist.  Eyes:     Extraocular Movements: Extraocular movements intact.     Pupils: Pupils are equal, round, and reactive to light.  Cardiovascular:     Rate and Rhythm: Normal rate and regular rhythm.  Pulmonary:     Effort: Pulmonary effort is normal.     Breath sounds: Normal breath sounds.  Abdominal:     General: Abdomen is flat. Bowel sounds are normal.     Palpations: Abdomen is soft.     Tenderness: There is no abdominal tenderness.  Hernia: No hernia is present.  Skin:    General: Skin is warm and dry.     Capillary Refill: Capillary refill takes less than 2 seconds.  Neurological:     General: No focal deficit present.     Mental Status: She is alert.  Psychiatric:        Mood and Affect: Mood normal.        Behavior: Behavior normal.    ED Results / Procedures / Treatments   Labs (all labs ordered are listed, but only abnormal results are displayed) Labs Reviewed  COMPREHENSIVE METABOLIC PANEL - Abnormal; Notable for the following components:      Result Value   Sodium 133 (*)    Glucose, Bld 146 (*)    All other components within normal limits  CBC WITH DIFFERENTIAL/PLATELET - Abnormal; Notable for the following components:   WBC 11.6 (*)    Hemoglobin 11.1 (*)    HCT  34.9 (*)    MCH 25.9 (*)    Platelets 115 (*)    Neutro Abs 9.0 (*)    All other components within normal limits  LIPASE, BLOOD  URINALYSIS, ROUTINE W REFLEX MICROSCOPIC  I-STAT BETA HCG BLOOD, ED ([facility], [facility], [facility] ONLY)    EKG None  Radiology CT ABDOMEN PELVIS W CONTRAST  Result Date: 03/10/2021 CLINICAL DATA:  Abdominal pain, acute, nonlocalized severe gen abd pain EXAM: CT ABDOMEN AND PELVIS WITH CONTRAST TECHNIQUE: Multidetector CT imaging of the abdomen and pelvis was performed using the standard protocol following bolus administration of intravenous contrast. CONTRAST:  OMNIPAQUE IOHEXOL 300 MG/ML  SOLN COMPARISON:  CT AP and US Abdomen, 01/13/2021. FINDINGS: Lower chest: No acute abnormality. Hepatobiliary: No focal liver abnormality is seen. No gallstones, gallbladder wall thickening, or biliary dilatation. Pancreas: No pancreatic ductal dilatation or surrounding inflammatory changes. Spleen: Normal in size without focal abnormality. Adrenals/Urinary Tract: Adrenal glands are unremarkable. Subcentimeter hypodense RIGHT renal cortical lesions are too small to adequately characterize though likely small cysts. The kidneys are otherwise normal, without renal calculi or hydronephrosis. Bladder is unremarkable. Stomach/Bowel: Stomach is within normal limits. Appendix is retrocecal and appears normal. No evidence of bowel wall thickening, distention, or inflammatory changes. Vascular/Lymphatic: No significant vascular findings are present. No enlarged abdominal or pelvic lymph nodes. Reproductive: Dominant RIGHT ovarian follicle. Uterus and adnexa are otherwise unremarkable. Other: Small, fat-containing umbilical hernia. No abdominopelvic ascites. Musculoskeletal: No acute or significant osseous findings. IMPRESSION: No acute abdominopelvic findings. Electronically Signed   By: Roanna Banning M.D.   On: 03/10/2021 10:06    Procedures Procedures  Medications Ordered in ED Medications   oxyCODONE-acetaminophen (PERCOCET/ROXICET) 5-325 MG per tablet 1 tablet (has no administration in time range)  morphine 2 MG/ML injection 4 mg (4 mg Intravenous Given 03/10/21 0729)  iohexol (OMNIPAQUE) 300 MG/ML solution 100 mL (100 mLs Intravenous Contrast Given 03/10/21 0957)    ED Course/ Medical Decision Making/ A&P                           Medical Decision Making Amount and/or Complexity of Data Reviewed External Data Reviewed: labs. Labs: ordered. Decision-making details documented in ED Course. Radiology: ordered and independent interpretation performed. Decision-making details documented in ED Course. Discussion of management or test interpretation with external provider(s): 41 yo female presents today with avd pain.  DDX includes, but is not limited to:  Right Upper Quadrant  Biliary colic Cholangitis Cholecystitis Fitz-Hugh-Curtis Syndrome Hepatitis Hepatic abscess  Hepatic congestion Herpes zoster Mesenteric ischemia Perforated duodenal ulcer Pneumonia (RLL) Pulmonary embolism Pyelonephritis/nephrolithiasis Appendicitis Pregnancy related issues-  ectopic pregnancy Diseases of pelvis and female reproductive organs- pid, toa,   Imaging and labs will be obtained for further evaluation.  Patient received iv fluids, antiemetics and pain medication. Patient feels improved.  Labs reviewed and interpreted. CBC Cmet Urinalysis Lipase Pregnancy test  Doubt Gallbladder disease including biliary colic, cholangitis, cholecystitis, hepatitis or other diseases of liver and gb based on history, labs, and imaging. Doubt Fitz-Hugh-Curtis Syndrome based on history, labs Doubt mesenteric ischemia based on labs, history, patient age Doubt lung etiology based on history, exam, labs and imaging Doubt pyelonephritis based on history, urinalysis, labs, and imaging.   Risk Risk Details: Patient presents today with abdominal pain with one episode of vomiting.  She states that she  ate goat meat last night.  Work-up here does not reveal any definitive specific etiology.  Patient has improved without specific intervention.  She is tolerating fluids here. Patient is hemodynamically stable.  Plan Protonix.  Plan referral to GI outpatient.   Final Clinical Impression(s) / ED Diagnoses Final diagnoses:  Generalized abdominal pain    Rx / DC Orders ED Discharge Orders          Ordered    pantoprazole (PROTONIX) 20 MG tablet  Daily        03/10/21 1935    ondansetron (ZOFRAN) 8 MG tablet  Every 8 hours PRN        03/10/21 Tye Savoy1935              Allana Shrestha, MD 03/10/21 1939

## 2021-03-10 NOTE — ED Provider Triage Note (Signed)
Emergency Medicine Provider Triage Evaluation Note  Kristine Silva , a 41 y.o. female  was evaluated in triage.  Pt complains of severe generalized abdominal pain seems that it began 11 PM last night has been constant since.  She has not been able to sleep because of the severity of the pain.  She states that she has been taking her medications that she was prescribed at her last ER visit but ran out of these medications.  Kristine Silva's interpreter was used for the entirety of visit.  She denies any nausea vomiting or diarrhea.  No CP or SOB.   Review of Systems  Positive: Abdominal pain Negative: Fever  Physical Exam  BP 128/70 (BP Location: Left Arm)    Pulse 82    Temp 98.2 F (36.8 C) (Oral)    Resp 20    SpO2 100%  Gen:   Awake, acutely uncomfortable, holding abdomen Resp:  Normal effort  MSK:   Moves extremities without difficulty  Other:  Abdomen is soft but diffusely tender.  No guarding or rebound.  Medical Decision Making  Medically screening exam initiated at 7:17 AM.  Appropriate orders placed.  Kristine Silva was informed that the remainder of the evaluation will be completed by another provider, this initial triage assessment does not replace that evaluation, and the importance of remaining in the ED until their evaluation is complete.  Labs and CT AP ordered. Istat hcg and labs + urine   Kristine Silva, [place] 03/10/21 [phone]

## 2021-03-10 NOTE — ED Triage Notes (Addendum)
Pt here via EMS from home with c/o abdominal pain at the umbilical. Woke her up in the middle of night. Reports Nausea with 1 episode of emesis  130/90 HR 80 RR 20 99% RA CBG 140

## 2021-03-13 ENCOUNTER — Telehealth: Payer: Self-pay

## 2021-03-13 NOTE — Telephone Encounter (Signed)
Transition Care Management Unsuccessful Follow-up Telephone Call  Date of discharge and from where:  03/10/2021-[place]   Attempts:  1st Attempt  Reason for unsuccessful TCM follow-up call:  Unable to leave message  No PCP, Possible referral needed

## 2021-03-14 NOTE — Telephone Encounter (Signed)
Transition Care Management Unsuccessful Follow-up Telephone Call  Date of discharge and from where:  03/10/2021-[place]   Attempts:  2nd Attempt  Reason for unsuccessful TCM follow-up call:  Unable to leave message

## 2021-03-15 NOTE — Telephone Encounter (Signed)
Transition Care Management Unsuccessful Follow-up Telephone Call  Date of discharge and from where:  03/10/2021-[place]   Attempts:  3rd Attempt  Reason for unsuccessful TCM follow-up call:  Unable to leave message

## 2021-09-18 ENCOUNTER — Emergency Department (HOSPITAL_COMMUNITY): Payer: Medicaid Other

## 2021-09-18 ENCOUNTER — Other Ambulatory Visit (HOSPITAL_COMMUNITY): Payer: Self-pay

## 2021-09-18 ENCOUNTER — Encounter (HOSPITAL_COMMUNITY): Payer: Self-pay

## 2021-09-18 ENCOUNTER — Other Ambulatory Visit: Payer: Self-pay

## 2021-09-18 ENCOUNTER — Emergency Department (HOSPITAL_COMMUNITY)
Admission: EM | Admit: 2021-09-18 | Discharge: 2021-09-18 | Disposition: A | Discharge status: Home / Self Care | Payer: Medicaid Other | Attending: Emergency Medicine | Admitting: Emergency Medicine

## 2021-09-18 DIAGNOSIS — R1084 Generalized abdominal pain: Secondary | ICD-10-CM

## 2021-09-18 DIAGNOSIS — R1033 Periumbilical pain: Secondary | ICD-10-CM | POA: Insufficient documentation

## 2021-09-18 LAB — CBC
HCT: 39 % (ref 36.0–46.0)
Hemoglobin: 12.3 g/dL (ref 12.0–15.0)
MCH: 25.7 pg — ABNORMAL LOW (ref 26.0–34.0)
MCHC: 31.5 g/dL (ref 30.0–36.0)
MCV: 81.4 fL (ref 80.0–100.0)
Platelets: 169 10*3/uL (ref 150–400)
RBC: 4.79 MIL/uL (ref 3.87–5.11)
RDW: 13.6 % (ref 11.5–15.5)
WBC: 8.1 10*3/uL (ref 4.0–10.5)
nRBC: 0 % (ref 0.0–0.2)

## 2021-09-18 LAB — COMPREHENSIVE METABOLIC PANEL
ALT: 16 U/L (ref 0–44)
AST: 15 U/L (ref 15–41)
Albumin: 4.6 g/dL (ref 3.5–5.0)
Alkaline Phosphatase: 61 U/L (ref 38–126)
Anion gap: 6 (ref 5–15)
BUN: 10 mg/dL (ref 6–20)
CO2: 24 mmol/L (ref 22–32)
Calcium: 9 mg/dL (ref 8.9–10.3)
Chloride: 108 mmol/L (ref 98–111)
Creatinine, Ser: 0.6 mg/dL (ref 0.44–1.00)
GFR, Estimated: >60 mL/min (ref >60)
Glucose, Bld: 118 mg/dL — ABNORMAL HIGH (ref 70–99)
Potassium: 3.5 mmol/L (ref 3.5–5.1)
Sodium: 138 mmol/L (ref 135–145)
Total Bilirubin: 0.6 mg/dL (ref 0.3–1.2)
Total Protein: 8.3 g/dL — ABNORMAL HIGH (ref 6.5–8.1)

## 2021-09-18 LAB — I-STAT BETA HCG BLOOD, ED (MC, WL, AP ONLY): I-stat hCG, quantitative: <5 m[IU]/mL (ref <5)

## 2021-09-18 LAB — URINALYSIS, ROUTINE W REFLEX MICROSCOPIC
Bilirubin Urine: NEGATIVE
Glucose, UA: NEGATIVE mg/dL
Hgb urine dipstick: NEGATIVE
Ketones, ur: NEGATIVE mg/dL
Leukocytes,Ua: NEGATIVE
Nitrite: NEGATIVE
Protein, ur: NEGATIVE mg/dL
Specific Gravity, Urine: 1.008 (ref 1.005–1.030)
pH: 5 (ref 5.0–8.0)

## 2021-09-18 LAB — LIPASE, BLOOD: Lipase: 50 U/L (ref 11–51)

## 2021-09-18 MED ORDER — ALUM & MAG HYDROXIDE-SIMETH 200-200-20 MG/5ML PO SUSP
30.0000 mL | Freq: Once | ORAL | Status: AC
  Administered 2021-09-18: 30 mL via ORAL
  Filled 2021-09-18: qty 30

## 2021-09-18 MED ORDER — PANTOPRAZOLE SODIUM 20 MG PO TBEC
20.0000 mg | DELAYED_RELEASE_TABLET | Freq: Every day | ORAL | 0 refills | Status: AC
Start: 2021-09-18 — End: ?
  Filled 2021-09-18: qty 30, 30d supply, fill #0

## 2021-09-18 MED ORDER — IOHEXOL 300 MG/ML  SOLN
100.0000 mL | Freq: Once | INTRAMUSCULAR | Status: AC | PRN
  Administered 2021-09-18: 100 mL via INTRAVENOUS

## 2021-09-18 MED ORDER — SODIUM CHLORIDE 0.9 % IV BOLUS
1000.0000 mL | Freq: Once | INTRAVENOUS | Status: AC
  Administered 2021-09-18: 1000 mL via INTRAVENOUS

## 2021-09-18 MED ORDER — DICYCLOMINE HCL 10 MG PO CAPS
10.0000 mg | ORAL_CAPSULE | Freq: Once | ORAL | Status: AC
  Administered 2021-09-18: 10 mg via ORAL
  Filled 2021-09-18: qty 1

## 2021-09-18 MED ORDER — MORPHINE SULFATE (PF) 4 MG/ML IV SOLN
4.0000 mg | Freq: Once | INTRAVENOUS | Status: AC
Start: 2021-09-18 — End: 2021-09-18
  Administered 2021-09-18: 4 mg via INTRAVENOUS
  Filled 2021-09-18: qty 1

## 2021-09-18 NOTE — ED Triage Notes (Signed)
ABD pian in all 4 quadrants - has been seen for same @ [facility]

## 2021-09-18 NOTE — ED Provider Notes (Signed)
[facility]-EMERGENCY DEPT Provider Note   CSN: [id number] Arrival date & time: 09/18/21  8185     History  Chief Complaint  Patient presents with   Abdominal Pain    Kristine Silva is a 41 y.o. female.  Patient with no pertinent past medical history presents today with complaints of abdominal pain. She states that same awoke her from sleep around 4 am this morning and has been persistent since then. She states that it is located in the periumbilical region and is sharp in nature and does not radiate. Denies any associated symptoms including nausea, vomiting, or diarrhea. Last bowel movement was last night and was normal. LMP 8 days ago and was normal. States she was here for similar in 11/22 and 1/23 with similar symptoms, had cT imaging and labs that were unremarkable and was sent home with PPI and GI referral. She states she has not been to see GI at this time. Denies any fevers, chills, or dysuria. Hx c section, no other abdominal surgeries.  The history is provided by the patient. A language interpreter was used.  Abdominal Pain      Home Medications Prior to Admission medications   Medication Sig Start Date End Date Taking? Authorizing Provider  famotidine (PEPCID) 20 MG tablet Take 1 tablet (20 mg total) by mouth 2 (two) times daily. 01/13/21   Arby Barrette, MD  FEROSUL 325 (65 Fe) MG tablet Take 325 mg by mouth 2 (two) times a day. 09/11/18   [provider]  ibuprofen (ADVIL) 800 MG tablet Take 1 tablet (800 mg total) by mouth every 6 (six) hours. Patient not taking: Reported on 10/27/2018 10/01/18   Arvilla Market, MD  ondansetron (ZOFRAN ODT) 4 MG disintegrating tablet Take 1 tablet (4 mg total) by mouth every 4 (four) hours as needed for nausea or vomiting. 01/13/21   Arby Barrette, MD  ondansetron (ZOFRAN) 8 MG tablet Take 1 tablet (8 mg total) by mouth every 8 (eight) hours as needed for nausea or vomiting. 03/10/21   Margarita Grizzle, MD  pantoprazole (PROTONIX) 20 MG tablet Take 1 tablet (20 mg total) by mouth daily. 03/10/21   Margarita Grizzle, MD  Prenatal 27-1 MG TABS Take 1 tablet by mouth daily. 09/11/18   [provider]  traMADol Janean Sark) 50 MG tablet 1-2 tablets every 6 hours as needed for pain 01/13/21   Arby Barrette, MD      Allergies    Patient has no known allergies.    Review of Systems   Review of Systems  Gastrointestinal:  Positive for abdominal pain.  All other systems reviewed and are negative.   Physical Exam Updated Vital Signs BP 123/83   Pulse 72   Temp 97.7 F (36.5 C) (Oral)   Resp 20   Ht 5\' 2"  (1.575 m)   Wt 63.5 kg   SpO2 100%   BMI 25.60 kg/m  Physical Exam Vitals and nursing note reviewed.  Constitutional:      General: She is not in acute distress.    Appearance: Normal appearance. She is normal weight. She is not ill-appearing, toxic-appearing or diaphoretic.  HENT:     Head: Normocephalic and atraumatic.  Cardiovascular:     Rate and Rhythm: Normal rate and regular rhythm.  Pulmonary:     Effort: Pulmonary effort is normal. No respiratory distress.     Breath sounds: Normal breath sounds.  Abdominal:     General: Abdomen is flat.  Palpations: Abdomen is soft.     Tenderness: There is abdominal tenderness in the periumbilical area.  Musculoskeletal:        General: Normal range of motion.     Cervical back: Normal range of motion.  Skin:    General: Skin is warm and dry.  Neurological:     General: No focal deficit present.     Mental Status: She is alert.  Psychiatric:        Mood and Affect: Mood normal.        Behavior: Behavior normal.     ED Results / Procedures / Treatments   Labs (all labs ordered are listed, but only abnormal results are displayed) Labs Reviewed  COMPREHENSIVE METABOLIC PANEL - Abnormal; Notable for the following components:      Result Value   Glucose, Bld 118 (*)    Total Protein 8.3 (*)    All other  components within normal limits  CBC - Abnormal; Notable for the following components:   MCH 25.7 (*)    All other components within normal limits  LIPASE, BLOOD  URINALYSIS, ROUTINE W REFLEX MICROSCOPIC  I-STAT BETA HCG BLOOD, ED ([facility], [facility], [facility] ONLY)    EKG None  Radiology CT ABDOMEN PELVIS W CONTRAST  Result Date: 09/18/2021 CLINICAL DATA:  Abdominal pain, acute nonlocalized. EXAM: CT ABDOMEN AND PELVIS WITH CONTRAST TECHNIQUE: Multidetector CT imaging of the abdomen and pelvis was performed using the standard protocol following bolus administration of intravenous contrast. RADIATION DOSE REDUCTION: This exam was performed according to the departmental dose-optimization program which includes automated exposure control, adjustment of the mA and/or kV according to patient size and/or use of iterative reconstruction technique. CONTRAST:  OMNIPAQUE IOHEXOL 300 MG/ML  SOLN COMPARISON:  Abdominopelvic CT 03/10/2021 FINDINGS: Lower chest: Mild dependent atelectasis at both lung bases. The lung bases are otherwise clear. No significant pleural or pericardial effusion. Hepatobiliary: Probable mild heterogeneous hepatic steatosis with relative sparing around the gallbladder, similar to previous study. No suspicious focal abnormality. No evidence of gallstones, gallbladder wall thickening or biliary dilatation. Pancreas: Unremarkable. No pancreatic ductal dilatation or surrounding inflammatory changes. Spleen: Normal in size without focal abnormality. Adrenals/Urinary Tract: Both adrenal glands appear normal. Unchanged small right renal cysts which do not require any specific imaging follow-up. The kidneys otherwise appear normal, without hydronephrosis or urinary tract calculus. The bladder appears unremarkable for its degree of distention. Stomach/Bowel: No enteric contrast administered. The stomach appears unremarkable for its degree of distension. No evidence of bowel wall thickening, distention or  surrounding inflammatory change. The appendix appears normal. Vascular/Lymphatic: There are no enlarged abdominal or pelvic lymph nodes. No significant vascular findings. 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 Other: Stable broad-based umbilical hernia containing only fat. No ascites or free air. Musculoskeletal: No acute or significant osseous findings. IMPRESSION: Stable abdominopelvic CT without acute or significant findings. Electronically Signed   By: Carey Bullocks M.D.   On: 09/18/2021 10:22    Procedures Procedures    Medications Ordered in ED Medications  alum & mag hydroxide-simeth (MAALOX/MYLANTA) 200-200-20 MG/5ML suspension 30 mL (has no administration in time range)  dicyclomine (BENTYL) capsule 10 mg (has no administration in time range)  sodium chloride 0.9 % bolus 1,000 mL (0 mLs Intravenous Stopped 09/18/21 0915)  morphine (PF) 4 MG/ML injection 4 mg (4 mg Intravenous Given 09/18/21 0739)  iohexol (OMNIPAQUE) 300 MG/ML solution 100 mL (100 mLs  Intravenous Contrast Given 09/18/21 1001)    ED Course/ Medical Decision Making/ A&P                           Medical Decision Making Amount and/or Complexity of Data Reviewed Labs: ordered. Radiology: ordered.  Risk OTC drugs. Prescription drug management.   This patient presents to the ED for concern of abdominal pain, this involves an extensive number of treatment options, and is a complaint that carries with it a high risk of complications and morbidity.   Co morbidities that complicate the patient evaluation  none   Additional history obtained:  Additional history obtained from epic chart review of previous ER notes   Lab Tests:  I Ordered, and personally interpreted labs.  The pertinent results include:  no acute laboratory findings   Imaging Studies ordered:  I ordered imaging studies including CT  abdomen pelvis with contrast  I independently visualized and interpreted imaging which showed  No acute findings I agree with the radiologist interpretation   Problem List / ED Course / Critical interventions / Medication management  I ordered medication including fluids, morphine, bentyl, and gi cocktail  for pain  Reevaluation of the patient after these medicines showed that the patient improved I have reviewed the patients home medicines and have made adjustments as needed   Social Determinants of Health:  Speaks Nepali only   Test / Admission - Considered:  Patient presents today with acute onset abdominal pain. Patient is nontoxic, nonseptic appearing, in no apparent distress.  Patient's pain and other symptoms adequately managed in emergency department.  Fluid bolus given.  Labs, imaging and vitals reviewed.  Patient does not meet the SIRS or Sepsis criteria.  On repeat exam patient does not have a surgical abdomin and there are no peritoneal signs.  No indication of appendicitis, bowel obstruction, bowel perforation, cholecystitis, diverticulitis, PID or ectopic pregnancy. After reassessment, patient states that she is feeling much better after medication management. She is therefore stable for discharge at this time. Patient discharged home with Protonix for symptomatic treatment and given strict instructions for follow-up with their primary care physician. Will also give referral to GI for further evaluation of symptoms. I have also discussed reasons to return immediately to the ER.  Patient expresses understanding and agrees with plan. Discharged in stable condition   Final Clinical Impression(s) / ED Diagnoses Final diagnoses:  Generalized abdominal pain    Rx / DC Orders ED Discharge Orders          Ordered    pantoprazole (PROTONIX) 20 MG tablet  Daily        09/18/21 1255          An After Visit Summary was printed and given to the patient.     Vear Clock 09/18/21 1314    Linwood Dibbles, MD 09/18/21 [phone]

## 2021-09-18 NOTE — Discharge Instructions (Signed)
As we discussed, your work-up in the ER today was reassuring for acute abnormalities. CT imaging and laboratory evaluation did not show any emergent concerns. I have given you a prescription for a medication to help with your symptoms at home. Please take this medication thirty minutes before the biggest meal of the day. I have also given you a referral to gastroenterology with a number to call to schedule an appointment for continued evaluation and management of your symptoms. Return if development of any new or worsening symptoms.  ?????? ???? ??????, ?? ER ?? ??????? ?????-?? ????? ??????????????? ???? ??????? ????? ???? ?????? ? ?????????? ???????????? ???? ??????? ????????? ??????? ???? ???????? ???? ??????? ?????????? ????? ???? ?????? ???? ?? ?????????????? ????? ??? ????? ?? ???? ????? ???????? ???? ???? ????? ??? ????? ??? ????????? ???? ???????? ??????? ?????????? ??????? ?????????? ? ???????????? ???? ???????????? ???????? ???? ?? ???? ????? ???? ?????????????????????? ??????? ??? ????? ??? ???? ???? ?? ???????? ???? ????? ??????? ??????????? H?m?l? chalaphala gar?jhai?, ?ja ER m? tap?'??k? k?rya-apa t?vra as?m?n'yat?har?k? l?gi ??vasta thiy?Marland Kitchen S??? im?ji?a ra pray?ga??l? m?ly??kanal? kunai ?kasmika cint?har? d?kh?'?na. Mail? tap?'?nl?'? gharam? tap?'??k? lak?a?ahar?m? maddata garna au?adhik? l?gi ?ka priskrip?ana di'?k? chu. Kr?pay? y? au?adhi dinak? sabaibhand? ?h?l? kh?n? bhand? t?sa min??a aghi linuh?s. Mail? tap?'im?l?'? tap?'im?k? lak?a?ahar?k? nirantara m?ly??kana ra vyavasth?panak? l?gi ap?'in?am?n?a anus?cita garna kala garna nambara sahita gy?s?r?'?n??r?laj?k? sandarbha pani di'?k? chu. Kunai nay?m? v? bigram?dai ga'?k? lak?a?a d?khi'?m? pharkanuh?s.

## 2021-09-26 ENCOUNTER — Other Ambulatory Visit (HOSPITAL_COMMUNITY): Payer: Self-pay
